# Patient Record
Sex: Male | Born: 1980 | Race: Asian | Hispanic: No | Marital: Single | Smoking: Never smoker
Health system: Southern US, Community
[De-identification: ages and names within clinical notes are randomized; demographics above are authoritative.]

## PROBLEM LIST (undated history)

## (undated) DIAGNOSIS — N200 Calculus of kidney: Secondary | ICD-10-CM

## (undated) DIAGNOSIS — F329 Major depressive disorder, single episode, unspecified: Secondary | ICD-10-CM

## (undated) DIAGNOSIS — E559 Vitamin D deficiency, unspecified: Secondary | ICD-10-CM

## (undated) DIAGNOSIS — S92919A Unspecified fracture of unspecified toe(s), initial encounter for closed fracture: Secondary | ICD-10-CM

## (undated) DIAGNOSIS — J45909 Unspecified asthma, uncomplicated: Secondary | ICD-10-CM

## (undated) HISTORY — DX: Vitamin D deficiency, unspecified: E55.9

## (undated) HISTORY — DX: Major depressive disorder, single episode, unspecified: F32.9

## (undated) HISTORY — DX: Unspecified fracture of unspecified toe(s), initial encounter for closed fracture: S92.919A

## (undated) HISTORY — PX: LITHOTRIPSY: SUR834

## (undated) HISTORY — DX: Unspecified asthma, uncomplicated: J45.909

---

## 2006-08-24 ENCOUNTER — Ambulatory Visit: Payer: Self-pay | Admitting: Internal Medicine

## 2006-08-24 DIAGNOSIS — B009 Herpesviral infection, unspecified: Secondary | ICD-10-CM | POA: Insufficient documentation

## 2006-08-24 DIAGNOSIS — K648 Other hemorrhoids: Secondary | ICD-10-CM | POA: Insufficient documentation

## 2006-08-28 ENCOUNTER — Encounter (INDEPENDENT_AMBULATORY_CARE_PROVIDER_SITE_OTHER): Payer: Self-pay | Admitting: *Deleted

## 2006-08-28 LAB — CONVERTED CEMR LAB
BUN: 15 mg/dL (ref 6–23)
Basophils Relative: 0.1 % (ref 0.0–1.0)
CO2: 30 meq/L (ref 19–32)
Chloride: 104 meq/L (ref 96–112)
Creatinine, Ser: 1.2 mg/dL (ref 0.4–1.5)
Direct LDL: 177.1 mg/dL
HCT: 45.3 % (ref 39.0–52.0)
Hemoglobin: 15.3 g/dL (ref 13.0–17.0)
Monocytes Absolute: 0.4 10*3/uL (ref 0.2–0.7)
Neutrophils Relative %: 55 % (ref 43.0–77.0)
Potassium: 3.7 meq/L (ref 3.5–5.1)
RBC: 5.06 M/uL (ref 4.22–5.81)
RDW: 11.6 % (ref 11.5–14.6)
Sodium: 142 meq/L (ref 135–145)
TSH: 1.92 microintl units/mL (ref 0.35–5.50)
Total CHOL/HDL Ratio: 5.7
Triglycerides: 94 mg/dL (ref 0–149)
VLDL: 19 mg/dL (ref 0–40)

## 2007-02-16 ENCOUNTER — Emergency Department (HOSPITAL_COMMUNITY): Admission: EM | Admit: 2007-02-16 | Discharge: 2007-02-16 | Payer: Self-pay | Admitting: Family Medicine

## 2007-12-05 ENCOUNTER — Emergency Department (HOSPITAL_COMMUNITY): Admission: EM | Admit: 2007-12-05 | Discharge: 2007-12-05 | Payer: Self-pay | Admitting: Family Medicine

## 2008-05-25 ENCOUNTER — Emergency Department (HOSPITAL_COMMUNITY): Admission: EM | Admit: 2008-05-25 | Discharge: 2008-05-25 | Payer: Self-pay | Admitting: Emergency Medicine

## 2008-05-28 ENCOUNTER — Ambulatory Visit (HOSPITAL_COMMUNITY): Admission: RE | Admit: 2008-05-28 | Discharge: 2008-05-28 | Payer: Self-pay | Admitting: Urology

## 2008-06-18 ENCOUNTER — Ambulatory Visit: Payer: Self-pay | Admitting: Internal Medicine

## 2008-06-22 LAB — CONVERTED CEMR LAB
ALT: 48 units/L (ref 0–53)
Basophils Relative: 0 % (ref 0.0–3.0)
CO2: 32 meq/L (ref 19–32)
Chloride: 104 meq/L (ref 96–112)
Creatinine, Ser: 1.1 mg/dL (ref 0.4–1.5)
Direct LDL: 142.2 mg/dL
Eosinophils Absolute: 0.1 10*3/uL (ref 0.0–0.7)
Eosinophils Relative: 1.9 % (ref 0.0–5.0)
HCT: 43.7 % (ref 39.0–52.0)
HDL: 42.7 mg/dL (ref >39.00)
Hemoglobin: 15.3 g/dL (ref 13.0–17.0)
Lymphs Abs: 1.3 10*3/uL (ref 0.7–4.0)
MCHC: 35 g/dL (ref 30.0–36.0)
MCV: 88.1 fL (ref 78.0–100.0)
Monocytes Absolute: 0.1 10*3/uL (ref 0.1–1.0)
Neutro Abs: 3.9 10*3/uL (ref 1.4–7.7)
Neutrophils Relative %: 70.5 % (ref 43.0–77.0)
Potassium: 4.2 meq/L (ref 3.5–5.1)
RBC: 4.96 M/uL (ref 4.22–5.81)
Total CHOL/HDL Ratio: 5
Triglycerides: 114 mg/dL (ref 0.0–149.0)
VLDL: 22.8 mg/dL (ref 0.0–40.0)
WBC: 5.4 10*3/uL (ref 4.5–10.5)

## 2009-01-24 ENCOUNTER — Emergency Department (HOSPITAL_COMMUNITY): Admission: EM | Admit: 2009-01-24 | Discharge: 2009-01-24 | Payer: Self-pay | Admitting: Emergency Medicine

## 2009-11-15 ENCOUNTER — Emergency Department (HOSPITAL_COMMUNITY): Admission: EM | Admit: 2009-11-15 | Discharge: 2009-11-15 | Payer: Self-pay | Admitting: Family Medicine

## 2010-05-12 LAB — URINALYSIS, ROUTINE W REFLEX MICROSCOPIC
Bilirubin Urine: NEGATIVE
Ketones, ur: NEGATIVE mg/dL
Nitrite: NEGATIVE
Protein, ur: NEGATIVE mg/dL
pH: 6 (ref 5.0–8.0)

## 2010-05-21 LAB — URINALYSIS, ROUTINE W REFLEX MICROSCOPIC
Bilirubin Urine: NEGATIVE
Ketones, ur: NEGATIVE mg/dL
Specific Gravity, Urine: 1.031 — ABNORMAL HIGH (ref 1.005–1.030)
Urobilinogen, UA: 0.2 mg/dL (ref 0.0–1.0)

## 2010-05-21 LAB — URINE MICROSCOPIC-ADD ON

## 2011-05-30 ENCOUNTER — Emergency Department (INDEPENDENT_AMBULATORY_CARE_PROVIDER_SITE_OTHER): Payer: 59

## 2011-05-30 ENCOUNTER — Encounter (HOSPITAL_BASED_OUTPATIENT_CLINIC_OR_DEPARTMENT_OTHER): Payer: Self-pay | Admitting: *Deleted

## 2011-05-30 ENCOUNTER — Emergency Department (HOSPITAL_BASED_OUTPATIENT_CLINIC_OR_DEPARTMENT_OTHER)
Admission: EM | Admit: 2011-05-30 | Discharge: 2011-05-31 | Disposition: A | Discharge status: Home / Self Care | Payer: 59 | Attending: Emergency Medicine | Admitting: Emergency Medicine

## 2011-05-30 DIAGNOSIS — Z09 Encounter for follow-up examination after completed treatment for conditions other than malignant neoplasm: Secondary | ICD-10-CM

## 2011-05-30 DIAGNOSIS — S93336A Other dislocation of unspecified foot, initial encounter: Secondary | ICD-10-CM

## 2011-05-30 DIAGNOSIS — S92919B Unspecified fracture of unspecified toe(s), initial encounter for open fracture: Secondary | ICD-10-CM | POA: Insufficient documentation

## 2011-05-30 DIAGNOSIS — S91309A Unspecified open wound, unspecified foot, initial encounter: Secondary | ICD-10-CM | POA: Insufficient documentation

## 2011-05-30 DIAGNOSIS — W108XXA Fall (on) (from) other stairs and steps, initial encounter: Secondary | ICD-10-CM | POA: Insufficient documentation

## 2011-05-30 DIAGNOSIS — S91109A Unspecified open wound of unspecified toe(s) without damage to nail, initial encounter: Secondary | ICD-10-CM | POA: Insufficient documentation

## 2011-05-30 DIAGNOSIS — W010XXA Fall on same level from slipping, tripping and stumbling without subsequent striking against object, initial encounter: Secondary | ICD-10-CM

## 2011-05-30 DIAGNOSIS — S91105A Unspecified open wound of left lesser toe(s) without damage to nail, initial encounter: Secondary | ICD-10-CM

## 2011-05-30 HISTORY — DX: Calculus of kidney: N20.0

## 2011-05-30 MED ORDER — BUPIVACAINE HCL 0.25 % IJ SOLN
INTRAMUSCULAR | Status: AC
  Administered 2011-05-30: 10 mL
  Filled 2011-05-30: qty 1

## 2011-05-30 MED ORDER — CEPHALEXIN 500 MG PO CAPS: 500.0000 mg | ORAL_CAPSULE | Freq: Four times a day (QID) | ORAL | Status: AC

## 2011-05-30 MED ORDER — LIDOCAINE HCL (PF) 1 % IJ SOLN
5.0000 mL | Freq: Once | INTRAMUSCULAR | Status: AC
  Administered 2011-05-30: 5 mL

## 2011-05-30 MED ORDER — OXYCODONE-ACETAMINOPHEN 5-325 MG PO TABS: 2.0000 | ORAL_TABLET | ORAL | Status: AC | PRN

## 2011-05-30 MED ORDER — CEFAZOLIN SODIUM 1-5 GM-% IV SOLN
1.0000 g | Freq: Once | INTRAVENOUS | Status: AC
  Administered 2011-05-30: 1 g via INTRAVENOUS
  Filled 2011-05-30: qty 50

## 2011-05-30 MED ORDER — BUPIVACAINE HCL 0.25 % IJ SOLN
10.0000 mL | Freq: Once | INTRAMUSCULAR | Status: AC
  Administered 2011-05-30: 10 mL

## 2011-05-30 MED ORDER — LIDOCAINE HCL (PF) 1 % IJ SOLN
INTRAMUSCULAR | Status: AC
  Administered 2011-05-30: 5 mL
  Filled 2011-05-30: qty 5

## 2011-05-30 MED ORDER — TETANUS-DIPHTH-ACELL PERTUSSIS 5-2.5-18.5 LF-MCG/0.5 IM SUSP
0.5000 mL | Freq: Once | INTRAMUSCULAR | Status: AC
  Administered 2011-05-30: 0.5 mL via INTRAMUSCULAR
  Filled 2011-05-30: qty 0.5

## 2011-05-30 NOTE — ED Provider Notes (Signed)
History   This chart was scribed for Rolan Bucco, MD by Charolett Bumpers . The patient was seen in room MH08/MH08 and the patient's care was started at 10:14pm.   CSN: [id number]  Arrival date & time 05/30/11  2118   First MD Initiated Contact with Patient 05/30/11 2142      Chief Complaint  Patient presents with  . Toe Injury    (Consider location/radiation/quality/duration/timing/severity/associated sxs/prior treatment) HPI Jeff Massey is a 31 y.o. male who presents to the Emergency Department complaining of constant, moderate left toe injury that occurred PTA. Patient states that he was walking up stairs when he tripped and injured toe. Patient states that the injury is to his left big toe. Patient states that the bone came through the skin. Patient reports minimal pain associated with the injury. Patient states that his sensation is still intact in left toes. Bleeding is currently controlled here in ED. Patient denies any other injuries. Patient states that his other toes feel normal. Patient states that his tetanus needs updating. No other symptoms reported. No pertinent medical hx reported.     Past Medical History  Diagnosis Date  . Kidney stones     History reviewed. No pertinent past surgical history.  History reviewed. No pertinent family history.  History  Substance Use Topics  . Smoking status: Never Smoker   . Smokeless tobacco: Not on file  . Alcohol Use:       Review of Systems A complete 10 system review of systems was obtained and all systems are negative except as noted in the HPI and PMH.   Allergies  Review of patient's allergies indicates no known allergies.  Home Medications   Current Outpatient Rx  Name Route Sig Dispense Refill  . CRANBERRY 1000 MG PO CAPS Oral Take 3 capsules by mouth 2 (two) times daily.    Marland Kitchen GARLIC 100 MG PO TABS Oral Take 2 tablets by mouth.    . MULTIVITAMINS PO TABS Oral Take 1 tablet by mouth daily.    .  CEPHALEXIN 500 MG PO CAPS Oral Take 1 capsule (500 mg total) by mouth 4 (four) times daily. 28 capsule 0  . OXYCODONE-ACETAMINOPHEN 5-325 MG PO TABS Oral Take 2 tablets by mouth every 4 (four) hours as needed for pain. 20 tablet 0    BP 149/90  Pulse 68  Temp(Src) 97.9 F (36.6 C) (Oral)  Resp 20  SpO2 100%  Physical Exam  Nursing note and vitals reviewed. Constitutional: He is oriented to person, place, and time. He appears well-developed and well-nourished. No distress.  HENT:  Head: Normocephalic and atraumatic.  Right Ear: External ear normal.  Left Ear: External ear normal.  Nose: Nose normal.  Eyes: Conjunctivae and EOM are normal. Pupils are equal, round, and reactive to light.  Neck: Normal range of motion. Neck supple. No tracheal deviation present.  Cardiovascular: Normal rate.   Pulmonary/Chest: Effort normal. No respiratory distress.  Abdominal: Soft. He exhibits no distension.  Musculoskeletal: Normal range of motion. He exhibits no edema.       Left big toe deformity with inoculation. Visible bone noted. 2 cm laceration over IP joint. Sensation intact to touch over distal toes. Capillary refill less than 2 seconds. No other body tenderness to left foot.    Neurological: He is alert and oriented to person, place, and time. No sensory deficit.  Skin: Skin is warm and dry.  Psychiatric: He has a normal mood and affect. His behavior is normal.  ED Course  Reduction of dislocation Date/Time: 05/30/2011 11:00 PM Performed by: Ashelynn Marks Authorized by: Rolan Bucco Consent: Verbal consent obtained. Risks and benefits: risks, benefits and alternatives were discussed Consent given by: patient Patient identity confirmed: verbally with patient Preparation: Patient was prepped and draped in the usual sterile fashion. Local anesthesia used: yes Anesthesia: digital block Local anesthetic: lidocaine 1% without epinephrine and bupivacaine 0.25% without  epinephrine Anesthetic total: 6 ml Patient sedated: no Patient tolerance: Patient tolerated the procedure well with no immediate complications. Comments: Toe reduced with traction/countertraction.  LACERATION REPAIR Date/Time: 05/30/2011 11:01 PM Performed by: Doyne Micke Authorized by: Rolan Bucco Consent: Verbal consent obtained. Risks and benefits: risks, benefits and alternatives were discussed Consent given by: patient Patient identity confirmed: verbally with patient Body area: lower extremity Location details: left foot Laceration length: 2 cm Foreign bodies: no foreign bodies Tendon involvement: none Nerve involvement: none Vascular damage: no Anesthesia: digital block Local anesthetic: lidocaine 1% without epinephrine and bupivacaine 0.25% without epinephrine Anesthetic total: 6 ml Preparation: Patient was prepped and draped in the usual sterile fashion. Irrigation solution: saline Irrigation method: syringe Amount of cleaning: extensive Skin closure: 4-0 Prolene Number of sutures: 4 Technique: simple Approximation: loose Approximation difficulty: simple Dressing: 4x4 sterile gauze Patient tolerance: Patient tolerated the procedure well with no immediate complications.   (including critical care time)  DIAGNOSTIC STUDIES: Oxygen Saturation is 100% on room air, normal by my interpretation.    COORDINATION OF CARE:  2214: Discussed planned course of treatment with the patient, who is agreeable at this time.  2215: Medication OrdersLidocaine (XYLOCAINE) 1 % injection 5 mL-Once;  Bupivacaine (MARCAINE) 0.25 % (with pres) injection 10 mL-Once  2220: Preformed a relocation of the left big toe and laceration repair 2230: Discussed f/u with an orthopedic specialist.  2245: Medication Orders: TDaP (BOOSTRIX) injection 0.5 mL Once; Cefazolin (ANCEF) IVPB 1 g/50 mL premix Once   Labs Reviewed - No data to display Dg Foot 2 Views Left  05/31/2011  *RADIOLOGY  REPORT*  Clinical Data: Post reduction of left first toe dislocation  LEFT FOOT - 2 VIEW  Comparison: 05/30/2011 at 2203 hours  Findings: Interval relocation of the first distal phalanx.  No fracture is seen.  The joint spaces are preserved.  Mild soft tissue irregularity along the lateral base of the first digit.  Possible mild soft tissue irregularity along the medial aspect of the third distal phalanx.  IMPRESSION: Interval relocation of the first distal phalanx.  Original Report Authenticated By: Charline Bills, M.D.   Dg Toe Great Left  05/30/2011  *RADIOLOGY REPORT*  Clinical Data: Injury to the left great toe after tripping on stairs.  LEFT GREAT TOE  Comparison: None.  Findings: There is complete medial dislocation of the distal phalanx of the left first toe with respect to the proximal phalanx. No associated fractures are appreciated.  IMPRESSION: Dislocation of the left first toe interphalangeal joint.  Original Report Authenticated By: Marlon Pel, M.D.     1. Dislocation of toe, left, open       MDM  PT with open toe dislocation.  Pt given IV abx.  TDAP updated. Reduced, wound repaired.  Spoke with Dr. Magnus Ivan who advises post op shoe, non weight bearing for now.  Will f/u in their office   I personally performed the services described in this documentation, which was scribed in my presence.  The recorded information has been reviewed and considered.       Rolan Bucco,  MD 05/31/11 0006

## 2011-05-30 NOTE — Discharge Instructions (Signed)
Toe Dislocation  Toe dislocation is the displacement of the large bone of your toe (metatarsal) from the socket that connects it to your foot. Very strong, fibrous tissues (ligaments) connect your toe to a bone in your foot and stabilize the joint where these 2 bones meet. Dislocation is caused by a forceful impact to your toe. This impact moves your toe off the joint and often injures the ligaments that support it.  SYMPTOMS  Symptoms of toe dislocation include:  · Noticeable deformity of your toe.  · Pain, with loss of movement.  · Looseness in your joint, indicating a tear of the ligaments (severe dislocations).  DIAGNOSIS  Toe dislocation is diagnosed through a physical exam. Often, X-ray exams are done to see if you have associated injuries, such as bone fractures.  TREATMENT  Toe dislocations are treated by putting your bones back into position (reduction) either by manipulation or through surgery. The toe is then kept in a fixed position (immobilized) in a cast, splint, or rigid postoperative shoe. In rare cases, surgical repair of a ligament is required, followed by immobilization of the toe.   HOME CARE INSTRUCTIONS  The following measures can help to reduce pain and speed up the healing process:  · Rest your injured joint. Do not move it. Avoid activities similar to the one that caused your injury.  · Apply ice to your injured joint for 1 to 2 days after your reduction or as directed by your caregiver. Applying ice helps to reduce inflammation and pain.  · Put ice in a plastic bag.  · Place a towel between your skin and the bag.  · Leave the ice on for 15 to 20 minutes at a time, every 2 hours while you are awake.  · Elevate your foot above your heart as directed by your caregiver.  · Take over-the-counter or perscription medicine for pain as directed by your caregiver.  · If your caregiver has taped your toe to an adjacent toe, change the tape as directed by your caregiver.  SEEK IMMEDIATE MEDICAL CARE  IF:  · Your cast or splint becomes loose or damaged.  · Your pain becomes worse, not better.  · You lose feeling in your toe, or you cannot bend the tip of your toe.  MAKE SURE YOU:  · Understand these instructions.  · Will watch your condition.  · Will get help right away if you are not doing well or get worse.  Document Released: 10/21/2000 Document Revised: 01/15/2011 Document Reviewed: 06/26/2010  ExitCare® Patient Information ©2012 [employer].

## 2011-05-30 NOTE — ED Notes (Signed)
Pt presents to ED today with Open great toe fx.  Pt reports "tripping up stairs".

## 2012-02-23 ENCOUNTER — Encounter: Payer: 59 | Admitting: Internal Medicine

## 2012-06-01 ENCOUNTER — Ambulatory Visit: Payer: 59

## 2012-06-01 ENCOUNTER — Ambulatory Visit (INDEPENDENT_AMBULATORY_CARE_PROVIDER_SITE_OTHER): Payer: 59 | Admitting: Family Medicine

## 2012-06-01 VITALS — BP 122/78 | HR 99 | Temp 99.0°F | Resp 18 | Ht 68.0 in | Wt 174.0 lb

## 2012-06-01 DIAGNOSIS — J45901 Unspecified asthma with (acute) exacerbation: Secondary | ICD-10-CM

## 2012-06-01 DIAGNOSIS — J309 Allergic rhinitis, unspecified: Secondary | ICD-10-CM

## 2012-06-01 DIAGNOSIS — R0602 Shortness of breath: Secondary | ICD-10-CM

## 2012-06-01 LAB — POCT CBC
Hemoglobin: 16.2 g/dL (ref 14.1–18.1)
Lymph, poc: 0.8 (ref 0.6–3.4)
MCH, POC: 29.5 pg (ref 27–31.2)
MCHC: 32.9 g/dL (ref 31.8–35.4)
MCV: 89.7 fL (ref 80–97)
MID (cbc): 0.4 (ref 0–0.9)
RBC: 5.5 M/uL (ref 4.69–6.13)
WBC: 13.8 10*3/uL — AB (ref 4.6–10.2)

## 2012-06-01 LAB — COMPREHENSIVE METABOLIC PANEL
Albumin: 4.7 g/dL (ref 3.5–5.2)
Alkaline Phosphatase: 59 U/L (ref 39–117)
CO2: 23 mEq/L (ref 19–32)
Calcium: 9.7 mg/dL (ref 8.4–10.5)
Chloride: 100 mEq/L (ref 96–112)
Glucose, Bld: 116 mg/dL — ABNORMAL HIGH (ref 70–99)
Potassium: 4.1 mEq/L (ref 3.5–5.3)
Sodium: 135 mEq/L (ref 135–145)
Total Protein: 7.5 g/dL (ref 6.0–8.3)

## 2012-06-01 LAB — D-DIMER, QUANTITATIVE: D-Dimer, Quant: 0.27 ug/mL-FEU (ref 0.00–0.48)

## 2012-06-01 LAB — POCT URINALYSIS DIPSTICK
Glucose, UA: NEGATIVE
Ketones, UA: NEGATIVE
Spec Grav, UA: 1.015

## 2012-06-01 LAB — TSH: TSH: 0.564 u[IU]/mL (ref 0.350–4.500)

## 2012-06-01 MED ORDER — LEVALBUTEROL HCL 0.63 MG/3ML IN NEBU
0.6300 mg | INHALATION_SOLUTION | Freq: Once | RESPIRATORY_TRACT | Status: AC
  Administered 2012-06-01: 0.63 mg via RESPIRATORY_TRACT

## 2012-06-01 MED ORDER — MOMETASONE FUROATE 50 MCG/ACT NA SUSP: 2.0000 | Freq: Every day | NASAL | Status: DC

## 2012-06-01 MED ORDER — ALBUTEROL SULFATE HFA 108 (90 BASE) MCG/ACT IN AERS: 2.0000 | INHALATION_SPRAY | Freq: Four times a day (QID) | RESPIRATORY_TRACT | Status: AC | PRN

## 2012-06-01 NOTE — Patient Instructions (Addendum)
1.  PRESENT TO EMERGENCY DEPARTMENT IMMEDIATELY FOR RECURRENT SHORTNESS OF BREATH.

## 2012-06-01 NOTE — Progress Notes (Signed)
[address]   [place], [place]  [address]   [phone]  Subjective:    Patient ID: Jeff Massey, male    DOB: 07/31/1980, 32 y.o.   MRN: [id number]  HPI This 32 y.o. male presents for evaluation of headache, tachycardia, muscle aches, dizziness, labored breathing/SOB.  No fever but +chills at work; no sweats.  +HA and dizziness; headache all over.  No sore throat, +rhinorrhea due to allergies.  No cough. No n/v/d.  Dizziness is not severe.  No palpitations currently.  Works at cath lab; cardiologist listened to patient; pulse 90, normal pulse oximetry. Ran yesterday.  Felt well yesterday.  No tick bites in past month.  No similar symptoms.  No travel; no recent surgeries.  No change in medications.  Taking OTC medication.  No abdominal pain.  No urinary symptoms.  No blurred vision or diplopia.  Yesterday, ran 3 miles on treadmill yesterday; did leg work out with squats, curls, calves; normal leg work out.  No bloody stools.  Has appointment early May for PCP for allergic rhinitis and new onset depression; no anxiety with depression symptoms; mostly down and out and angry.  History of asthma as child; last treatment for asthma age 20-6; does not remember asthma symptoms. Feels like unable to take a deep breath; no wheezing.  No chest pain but chest tightness.  No family history of clotting disorder.  Does not know father well but strong family history of heart disease.   No leg swelling or calf pain.   Review of Systems  Constitutional: Positive for chills and diaphoresis. Negative for fever and fatigue.  HENT: Positive for congestion, rhinorrhea and postnasal drip. Negative for ear pain, sore throat, drooling, trouble swallowing and voice change.   Respiratory: Positive for shortness of breath. Negative for cough, wheezing and stridor.   Cardiovascular: Positive for palpitations. Negative for chest pain and leg swelling.  Gastrointestinal: Negative for nausea, vomiting, abdominal pain, diarrhea,  blood in stool and abdominal distention.  Genitourinary: Negative for dysuria, urgency, frequency and hematuria.  Allergic/Immunologic: Positive for environmental allergies.  Neurological: Positive for dizziness and headaches. Negative for weakness, light-headedness and numbness.  Psychiatric/Behavioral: Positive for dysphoric mood. The patient is not nervous/anxious.         Past Medical History  Diagnosis Date  . Kidney stones     Past Surgical History  Procedure Laterality Date  . Lithotripsy      Prior to Admission medications   Medication Sig Start Date End Date Taking? Authorizing Provider  Cranberry 1000 MG CAPS Take 3 capsules by mouth 2 (two) times daily.   Yes Historical Provider, MD  Garlic 100 MG TABS Take 2 tablets by mouth.   Yes Historical Provider, MD  multivitamin [facility]) per tablet Take 1 tablet by mouth daily.   Yes Historical Provider, MD    No Known Allergies  History   Social History  . Marital Status: Single    Spouse Name: N/A    Number of Children: N/A  . Years of Education: N/A   Occupational History  .  [employer]    Cath lab   Social History Main Topics  . Smoking status: Never Smoker   . Smokeless tobacco: Not on file  . Alcohol Use: 0.6 oz/week    1 Glasses of wine per week     Comment: social  . Drug Use: No  . Sexually Active: Yes   Other Topics Concern  . Not on file   Social  History Narrative  . No narrative on file    Family History  Problem Relation Age of Onset  . Cancer Maternal Grandmother   . Cancer Maternal Grandfather     Objective:   Physical Exam  Nursing note and vitals reviewed. Constitutional: He is oriented to person, place, and time. He appears well-developed and well-nourished. He appears distressed.  HENT:  Head: Normocephalic and atraumatic.  Right Ear: External ear normal.  Left Ear: External ear normal.  Nose: Nose normal.  Mouth/Throat: Oropharynx is clear and moist.  Eyes: Conjunctivae  and EOM are normal. Pupils are equal, round, and reactive to light.  Neck: Normal range of motion. Neck supple. No JVD present. No tracheal deviation present. No thyromegaly present.  Cardiovascular: Normal rate, regular rhythm, normal heart sounds and intact distal pulses.  Exam reveals no gallop and no friction rub.   No murmur heard. Pulmonary/Chest: Breath sounds normal. No accessory muscle usage or stridor. Tachypnea noted. He is in respiratory distress. He has no wheezes. He has no rhonchi. He has no rales.  Mild respiratory distress; RR 24.  Abdominal: Soft. Bowel sounds are normal. He exhibits no distension and no mass. There is no tenderness. There is no rebound and no guarding.  Lymphadenopathy:    He has no cervical adenopathy.  Neurological: He is alert and oriented to person, place, and time. No cranial nerve deficit. He exhibits normal muscle tone. Coordination normal.  Skin: Skin is warm and dry. No rash noted. He is not diaphoretic.  Psychiatric: He has a normal mood and affect. His behavior is normal. Judgment and thought content normal.  Slight anxiety due to breathing issues/respiratory distress mild.    EKG: NSR; no acute changes.  [facility] reading (PRIMARY) by  Dr. Katrinka Blazing. CXR: NAD.  Results for orders placed in visit on 06/01/12  POCT CBC      Result Value Range   WBC 13.8 (*) 4.6 - 10.2 K/uL   Lymph, poc 0.8  0.6 - 3.4   POC LYMPH PERCENT 5.7 (*) 10 - 50 %L   MID (cbc) 0.4  0 - 0.9   POC MID % 3.0  0 - 12 %M   POC Granulocyte 12.6 (*) 2 - 6.9   Granulocyte percent 91.3 (*) 37 - 80 %G   RBC 5.50  4.69 - 6.13 M/uL   Hemoglobin 16.2  14.1 - 18.1 g/dL   HCT, POC 16.1  09.6 - 53.7 %   MCV 89.7  80 - 97 fL   MCH, POC 29.5  27 - 31.2 pg   MCHC 32.9  31.8 - 35.4 g/dL   RDW, POC 04.5     Platelet Count, POC 337  142 - 424 K/uL   MPV 9.6  0 - 99.8 fL  GLUCOSE, POCT (MANUAL RESULT ENTRY)      Result Value Range   POC Glucose 119 (*) 70 - 99 mg/dl  POCT URINALYSIS  DIPSTICK      Result Value Range   Color, UA yellow     Clarity, UA clear     Glucose, UA neg     Bilirubin, UA neg     Ketones, UA neg     Spec Grav, UA 1.015     Blood, UA trace     pH, UA 7.0     Protein, UA neg     Urobilinogen, UA 1.0     Nitrite, UA neg     Leukocytes, UA Negative  XOPENEX NEBULIZER ADMINISTERED IN OFFICE.    Assessment & Plan:  SOB (shortness of breath) - Plan: POCT CBC, POCT glucose (manual entry), POCT urinalysis dipstick, Comprehensive metabolic panel, TSH, D-dimer, quantitative, EKG 12-Lead, levalbuterol (XOPENEX) nebulizer solution 0.63 mg, DG Chest 2 View, 2D Echocardiogram without contrast  Allergic rhinitis - Plan: mometasone (NASONEX) 50 MCG/ACT nasal spray  Asthma with acute exacerbation - Plan: albuterol (PROVENTIL HFA;VENTOLIN HFA) 108 (90 BASE) MCG/ACT inhaler   1.  SOB:  New.  Acute onset.  Mild improvement with Xopenex nebulizer.  CXR negative.  Obtain labs including D-dimer; pt low risk for DVT/PE yet if D-dimer positive, will obtain CT angio.  Obtain 2D-echo to evaluate pericardium.  Feel SOB due to mild asthma exacerbation due to allergic rhinitis; with mild respiratory distress, patient developed anxiety.  Improvement during visit; pt comfortable to be discharged; agreeable to ED evaluation if recurrent symptoms. 2.  Allergic Rhinitis: worsening; rx for Nasonex provided.   3. Asthma with acute exacerbation:  New.  SOB improved with Xopenex nebulizer; recommend Albuterol HFA scheduled every six hours for next five days and then PRN.

## 2012-06-02 ENCOUNTER — Encounter: Payer: Self-pay | Admitting: Family Medicine

## 2012-06-06 ENCOUNTER — Telehealth: Payer: Self-pay

## 2012-06-06 NOTE — Telephone Encounter (Signed)
Noted  

## 2012-06-06 NOTE — Telephone Encounter (Signed)
Pt is calling back about some lab results Call back number is [phone]

## 2012-06-06 NOTE — Telephone Encounter (Signed)
Notes Recorded by Ethelda Chick, MD on 06/03/2012 at 12:22 PM Call --- 1. Blood sugar 116. 2. Liver and kidney functions normal. 3. Thyroid function normal. 4. D-dimer normal/negative. 5. How is he feeling? How is breathing? Called him, he states he is much better.

## 2012-06-16 ENCOUNTER — Encounter: Payer: Self-pay | Admitting: Internal Medicine

## 2012-06-16 ENCOUNTER — Ambulatory Visit (INDEPENDENT_AMBULATORY_CARE_PROVIDER_SITE_OTHER): Payer: 59 | Admitting: Internal Medicine

## 2012-06-16 VITALS — BP 112/72 | HR 59 | Temp 98.1°F | Wt 174.0 lb

## 2012-06-16 DIAGNOSIS — F329 Major depressive disorder, single episode, unspecified: Secondary | ICD-10-CM

## 2012-06-16 MED ORDER — ESCITALOPRAM OXALATE 10 MG PO TABS: 10.0000 mg | ORAL_TABLET | Freq: Every day | ORAL | Status: DC

## 2012-06-16 NOTE — Patient Instructions (Addendum)
Next visit 4 to 5 weeks

## 2012-06-16 NOTE — Progress Notes (Signed)
  Subjective:    Patient ID: Jeff Massey, male    DOB: 09/18/1980, 32 y.o.   MRN: [id number]  HPI New patient, last OV [id number]. 32 year old gentleman who reports a history of depression on and off for years, symptoms are usually during the winter time and last few months. This year however the symptoms are lasting much longer than usual: Depression is described as a feeling of  blue, sad, irritable, withdrawal from family and friends. He has a girlfriend and he can get "very mean" with her sometimes. Depression is episodic, he may get that way suddenly for several hours.  Past Medical History  Diagnosis Date  . Kidney stones   . Asthma   . Toe fracture    Past Surgical History  Procedure Laterality Date  . Lithotripsy       Family History:    Father: unknown    Mother: living    DM - M. 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 Denies any suicidal or homicidal ideas. No recent weight loss. He has been taking a number of herbal supplements for years. Occasional difficulty concentrating and some procrastination, those features have been more noticeable lately. He is usually easy going/happy   but he does not describe clear-cut manic episodes.     Objective:   Physical Exam General -- alert, well-developed, No apparent distress Neck --no thyromegaly Lungs -- normal respiratory effort, no intercostal retractions, no accessory muscle use, and normal breath sounds.   Heart-- normal rate, regular rhythm, no murmur, and no gallop.   Extremities-- no pretibial edema bilaterally  Neurologic-- alert & oriented X3 and strength normal in all extremities. Psych-- Cognition and judgment appear intact. Alert and  cooperative with normal attention span and concentration.  not anxious appearing and not depressed appearing.       Assessment & Plan:

## 2012-06-17 ENCOUNTER — Encounter: Payer: Self-pay | Admitting: Internal Medicine

## 2012-06-17 DIAGNOSIS — F32A Depression, unspecified: Secondary | ICD-10-CM

## 2012-06-17 DIAGNOSIS — F329 Major depressive disorder, single episode, unspecified: Secondary | ICD-10-CM | POA: Insufficient documentation

## 2012-06-17 HISTORY — DX: Depression, unspecified: F32.A

## 2012-06-17 NOTE — Assessment & Plan Note (Signed)
Depression, Episodic depression, Usually associated with winter, this year lasting longer than usual,no suicidal ideas. PHQ 9: #17. Given cyclic presentation he could be bipolar however he doesn't seem to have mania. Some lack of concentration, needs to be reassessed once depression is treated. We talk about possible referral to psychiatry versus SSRIs, we eventually agreed to SSRIs, will start Lexapro, he will watch for side effects, hyperactivity, etc. Encourage counseling. Followup in June.

## 2012-07-12 ENCOUNTER — Other Ambulatory Visit (HOSPITAL_COMMUNITY): Payer: 59

## 2012-07-28 ENCOUNTER — Encounter: Payer: 59 | Admitting: Internal Medicine

## 2012-07-29 ENCOUNTER — Encounter: Payer: 59 | Admitting: Internal Medicine

## 2012-08-08 ENCOUNTER — Other Ambulatory Visit: Payer: Self-pay | Admitting: Internal Medicine

## 2012-08-08 NOTE — Telephone Encounter (Signed)
Refill done. Scheduled future appointment 08/25/12.

## 2012-08-25 ENCOUNTER — Encounter: Payer: Self-pay | Admitting: Internal Medicine

## 2012-08-25 ENCOUNTER — Ambulatory Visit (INDEPENDENT_AMBULATORY_CARE_PROVIDER_SITE_OTHER): Payer: 59 | Admitting: Internal Medicine

## 2012-08-25 VITALS — BP 104/75 | HR 65 | Temp 98.3°F | Ht 67.0 in | Wt 173.8 lb

## 2012-08-25 DIAGNOSIS — F329 Major depressive disorder, single episode, unspecified: Secondary | ICD-10-CM

## 2012-08-25 DIAGNOSIS — Z0000 Encounter for general adult medical examination without abnormal findings: Secondary | ICD-10-CM | POA: Insufficient documentation

## 2012-08-25 LAB — HEMOGLOBIN A1C: Hgb A1c MFr Bld: 5.6 % (ref 4.6–6.5)

## 2012-08-25 LAB — CBC WITH DIFFERENTIAL/PLATELET
Basophils Absolute: 0 10*3/uL (ref 0.0–0.1)
Basophils Relative: 0.4 % (ref 0.0–3.0)
Eosinophils Relative: 1.9 % (ref 0.0–5.0)
HCT: 44.1 % (ref 39.0–52.0)
Hemoglobin: 15.3 g/dL (ref 13.0–17.0)
Lymphocytes Relative: 31.1 % (ref 12.0–46.0)
Lymphs Abs: 1.3 10*3/uL (ref 0.7–4.0)
Monocytes Relative: 6.7 % (ref 3.0–12.0)
Neutro Abs: 2.6 10*3/uL (ref 1.4–7.7)
RBC: 5.08 Mil/uL (ref 4.22–5.81)
WBC: 4.3 10*3/uL — ABNORMAL LOW (ref 4.5–10.5)

## 2012-08-25 LAB — LIPID PANEL
HDL: 39.1 mg/dL (ref >39.00)
Total CHOL/HDL Ratio: 6
VLDL: 25.8 mg/dL (ref 0.0–40.0)

## 2012-08-25 LAB — LDL CHOLESTEROL, DIRECT: Direct LDL: 146.1 mg/dL

## 2012-08-25 MED ORDER — ESCITALOPRAM OXALATE 10 MG PO TABS: 10.0000 mg | ORAL_TABLET | Freq: Every day | ORAL | Status: DC

## 2012-08-25 NOTE — Patient Instructions (Signed)
Testicular Problems and Self-Exam   Men can examine themselves easily and effectively with positive results. Monthly exams detect problems early and save lives. There are numerous causes of swelling in the testicle. Testicular cancer usually appears as a firm painless lump in the front part of the testicle. This may feel like a dull ache or heavy feeling located in the lower abdomen (belly), groin, or scrotum.   The risk is greater in men with undescended testicles and it is more common in young men. It is responsible for almost a fifth of cancers in males between ages 15 and 34. Other common causes of swellings, lumps, and testicular pain include injuries, inflammation (soreness) from infection, hydrocele, and torsion. These are a few of the reasons to do monthly self-examination of the testicles. The exam only takes minutes and could add years to your life. Get in the habit!   SELF-EXAMINATION OF THE TESTICLES   The testicles are easiest to examine after warm baths or showers and are more difficult to examine when you are cold. This is because the muscles attached to the testicles retract and pull them up higher or into the abdomen. While standing, roll one testicle between the thumb and forefinger. Feel for lumps, swelling, or discomfort. A normal testicle is egg shaped and feels firm. It is smooth and not tender. The spermatic cord can be felt as a firm spaghetti-like cord at the back of the testicle. It is also important to examine your groins. This is the crease between the front of your leg and your abdomen. Also, feel for enlarged lymph nodes (glands). Enlarged nodes are also a cause for you to see your caregiver for evaluation.   Self-examination of the testicles and groin areas on a regular basis will help you to know what your own testicles and groins feel like. This will help you pick up an abnormality (difference) at an earlier stage. Early discovery is the key to curing this cancer or treating other  conditions. Any lump, change, or swelling in the testicle calls for immediate evaluation by your caregiver. Cancer of the testicle does not result in impotence and it does not prevent normal intercourse or prevent having children. If your caregiver feels that medical treatment or chemotherapy could lead to infertility, sperm can be frozen for future use. It is necessary to see a caregiver as soon as possible after the discovery of a lump in a testicle.   Document Released: 05/04/2000 Document Revised: 04/20/2011 Document Reviewed: 01/28/2008   ExitCare® Patient Information ©2014 [employer].

## 2012-08-25 NOTE — Progress Notes (Signed)
  Subjective:    Patient ID: Jeff Massey, male    DOB: 10-14-1980, 32 y.o.   MRN: [id number]  HPI CPX  Past Medical History  Diagnosis Date  . Kidney stones   . Asthma   . Toe fracture    Past Surgical History  Procedure Laterality Date  . Lithotripsy     History   Social History  . Marital Status: Single    Spouse Name: N/A    Number of Children: 0  . Years of Education: N/A   Occupational History  . 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 Smoking status: Never Smoker   . Smokeless tobacco: Never Used  . Alcohol Use: 0.6 oz/week    1 Glasses of wine per week     Comment: social  . Drug Use: No  . Sexually Active: Yes   Other Topics Concern  . Not on file   Social History Narrative   Single, lives w/ girl frined, 3 dogs   Moved from [place] in 2006       Family History  Problem Relation Age of Onset  . Breast cancer Maternal Grandmother   . Cancer Maternal Grandfather   . CAD Neg Hx   . Diabetes Other   . Colon cancer Neg Hx   . Prostate cancer Neg Hx      Review of Systems Diet: Is a regular diet. Exercise, use to do more, currently going to the gym one or twice a week, plans to exercise more. Denies chest pain, shortness or breath. Denies nausea, vomiting, diarrhea or blood in the stools. Denies dysuria, gross hematuria, difficulty urinating.     Objective:   Physical Exam BP 104/75  Pulse 65  Temp(Src) 98.3 F (36.8 C) (Oral)  Ht 5\' 7"  (1.702 m)  Wt 173 lb 12.8 oz (78.835 kg)  BMI 27.21 kg/m2  SpO2 99%  General -- alert, well-developed, NAD  Neck --no thyromegaly Lungs -- normal respiratory effort, no intercostal retractions, no accessory muscle use, and normal breath sounds.   Heart-- normal rate, regular rhythm, no murmur, and no gallop.   Abdomen--soft, non-tender, no distention, no masses, no HSM, no guarding, and no rigidity.   Extremities-- no pretibial edema bilaterally Neurologic--  alert & oriented X3 and strength normal in all extremities. Psych-- Cognition and judgment appear intact. Alert and cooperative with normal attention span and concentration.  not anxious appearing and not depressed appearing.         Assessment & Plan:

## 2012-08-25 NOTE — Assessment & Plan Note (Signed)
Started Lexapro, good compliance and tolerance. Symptoms are definitely improved. We agreed to continue Lexapro, refills sent, will come back in 6 months. In the meantime if he feels he needs to increase the dose he will let me know.

## 2012-08-25 NOTE — Assessment & Plan Note (Signed)
Tdap 2007 Counseled about diet-exercise- STE Labs including STDs

## 2012-08-26 LAB — RPR

## 2012-08-26 LAB — HEPATITIS C ANTIBODY: HCV Ab: NEGATIVE

## 2012-08-26 LAB — HEPATITIS B CORE ANTIBODY, TOTAL: Hep B Core Total Ab: NEGATIVE

## 2012-08-26 LAB — HEPATITIS B SURFACE ANTIGEN: Hepatitis B Surface Ag: NEGATIVE

## 2012-08-30 ENCOUNTER — Telehealth: Payer: Self-pay | Admitting: *Deleted

## 2012-08-30 NOTE — Telephone Encounter (Signed)
Message copied by Shirlee More I on Tue Aug 30, 2012 10:16 AM ------      Message from: Willow Ora E      Created: Sun Aug 28, 2012  9:27 AM       Call patient,      All STDs are negative.      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      Cholesterol used to be better,it increased from 203 to 222, recommend to work on his lifestyle.      The blood sugar test hemoglobin A1c is 5.6,  Needs to recheck yearly. ------

## 2012-08-30 NOTE — Telephone Encounter (Signed)
lmovm to return call  °

## 2012-08-31 ENCOUNTER — Telehealth: Payer: Self-pay | Admitting: *Deleted

## 2012-08-31 NOTE — Telephone Encounter (Signed)
Patient called about recent lab work he had done.  I relayed all Dr. Drue Novel notes for patient and explained about the life style changes patient agreed and started that he will keep his future appointment for further review.

## 2012-08-31 NOTE — Telephone Encounter (Signed)
Discussed with patient, verbalized understanding.  

## 2012-09-15 ENCOUNTER — Encounter: Payer: Self-pay | Admitting: Internal Medicine

## 2012-09-15 NOTE — Telephone Encounter (Signed)
Pt. Preferred pharmacy changed in EPIC. Pt. [facility] contacted to cancel all refills left on his medications at that location. Pt. Encouraged to contact office when he is in need of refills so they can be forwarded to his new pharmacy of choice.

## 2012-10-04 ENCOUNTER — Telehealth: Payer: Self-pay | Admitting: General Practice

## 2012-10-04 ENCOUNTER — Encounter: Payer: Self-pay | Admitting: Internal Medicine

## 2012-10-04 MED ORDER — ESCITALOPRAM OXALATE 10 MG PO TABS: 10.0000 mg | ORAL_TABLET | Freq: Every day | ORAL | Status: DC

## 2012-10-04 NOTE — Telephone Encounter (Signed)
Spoke with pharmacy stated never received the lexapro refill from 08/25/2012. Called in the authorized refill on 10/04/12. JLT

## 2012-10-06 MED ORDER — ESCITALOPRAM OXALATE 10 MG PO TABS: 10.0000 mg | ORAL_TABLET | Freq: Every day | ORAL | Status: DC

## 2012-10-06 NOTE — Telephone Encounter (Signed)
Refilled to [facility]. Other med cancelled.

## 2012-12-07 ENCOUNTER — Encounter: Payer: Self-pay | Admitting: Internal Medicine

## 2012-12-07 ENCOUNTER — Other Ambulatory Visit: Payer: Self-pay | Admitting: Internal Medicine

## 2012-12-07 MED ORDER — ACYCLOVIR 5 % EX OINT: TOPICAL_OINTMENT | CUTANEOUS | Status: DC | PRN

## 2012-12-07 MED ORDER — VALACYCLOVIR HCL 1 G PO TABS: ORAL_TABLET | ORAL | Status: DC

## 2012-12-15 ENCOUNTER — Other Ambulatory Visit: Payer: Self-pay

## 2013-02-27 ENCOUNTER — Ambulatory Visit: Payer: 59 | Admitting: Internal Medicine

## 2013-02-27 DIAGNOSIS — Z0289 Encounter for other administrative examinations: Secondary | ICD-10-CM

## 2013-03-15 ENCOUNTER — Ambulatory Visit (INDEPENDENT_AMBULATORY_CARE_PROVIDER_SITE_OTHER): Payer: 59 | Admitting: Internal Medicine

## 2013-03-15 ENCOUNTER — Encounter: Payer: Self-pay | Admitting: Internal Medicine

## 2013-03-15 VITALS — BP 127/75 | HR 60 | Temp 98.2°F | Wt 176.0 lb

## 2013-03-15 DIAGNOSIS — F329 Major depressive disorder, single episode, unspecified: Secondary | ICD-10-CM

## 2013-03-15 DIAGNOSIS — F3289 Other specified depressive episodes: Secondary | ICD-10-CM

## 2013-03-15 DIAGNOSIS — F32A Depression, unspecified: Secondary | ICD-10-CM

## 2013-03-15 MED ORDER — ESCITALOPRAM OXALATE 10 MG PO TABS: 20.0000 mg | ORAL_TABLET | Freq: Every day | ORAL | Status: DC

## 2013-03-15 NOTE — Progress Notes (Signed)
Pre visit review using our clinic review tool, if applicable. No additional management support is needed unless otherwise documented below in the visit note. 

## 2013-03-15 NOTE — Progress Notes (Signed)
   Subjective:    Patient ID: Jeff Massey, male    DOB: 05/21/1980, 33 y.o.   MRN: [id number]  HPI ROV History of depression, on Lexapro, is working well for him except that still feels irritable from time to time , slightly depressed. Increased dose to 20 mg? Asthma and allergies well-controlled.   Past Medical History  Diagnosis Date  . Kidney stones   . Asthma   . Toe fracture   . Depression 06/17/2012   Past Surgical History  Procedure Laterality Date  . Lithotripsy     History   Social History  . Marital Status: Single    Spouse Name: N/A    Number of Children: 0  . Years of Education: N/A   Occupational History  . 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 Smoking status: Never Smoker   . Smokeless tobacco: Never Used  . Alcohol Use: 0.6 oz/week    1 Glasses of wine per week     Comment: social  . Drug Use: No  . Sexual Activity: Yes   Other Topics Concern  . Not on file   Social History Narrative   Single,  3 dogs   [place] in 2006         Review of Systems Is sleeping well most of the time, occasionally has difficulties he thinks because his schedule at work. No anxiety, no suicidal ideas Pt is living by himself again. Lifestyle is good, he is exercising     Objective:   Physical Exam  BP 127/75  Pulse 60  Temp(Src) 98.2 F (36.8 C)  Wt 176 lb (79.833 kg)  SpO2 99% General -- alert, well-developed, NAD.   Lungs -- normal respiratory effort, no intercostal retractions, no accessory muscle use, and normal breath sounds.  Heart-- normal rate, regular rhythm, no murmur.  Neurologic--  alert & oriented X3. Speech normal, gait normal, strength normal in all extremities.  Psych-- Cognition and judgment appear intact. Cooperative with normal attention span and concentration. No anxious or depressed appearing.      Assessment & Plan:

## 2013-03-15 NOTE — Assessment & Plan Note (Addendum)
Symptoms in general well controlled, still has days that he does not feel 100%. Patient wonders if he could increase to Lexapro 20 mg and it is okay. At some point he had a lack of concentration but that has improved Plan:  Increase Lexapro 20 mg, during the summertime he usually feels better consider  self decreased to 15 mg if patient so desire RTC 6 months

## 2013-03-15 NOTE — Patient Instructions (Signed)
  Next visit is for a physical exam in 6 months , fasting Please make an appointment      

## 2013-05-11 ENCOUNTER — Other Ambulatory Visit: Payer: Self-pay | Admitting: Internal Medicine

## 2013-05-11 MED ORDER — VALACYCLOVIR HCL 1 G PO TABS: ORAL_TABLET | ORAL | Status: DC

## 2013-05-24 ENCOUNTER — Other Ambulatory Visit: Payer: Self-pay

## 2013-05-24 DIAGNOSIS — J309 Allergic rhinitis, unspecified: Secondary | ICD-10-CM

## 2013-05-24 MED ORDER — MOMETASONE FUROATE 50 MCG/ACT NA SUSP: 2.0000 | Freq: Every day | NASAL | Status: DC

## 2013-06-01 ENCOUNTER — Other Ambulatory Visit: Payer: Self-pay | Admitting: Family Medicine

## 2013-06-01 DIAGNOSIS — J309 Allergic rhinitis, unspecified: Secondary | ICD-10-CM

## 2013-07-17 ENCOUNTER — Telehealth: Payer: Self-pay | Admitting: Internal Medicine

## 2013-07-17 NOTE — Telephone Encounter (Signed)
Caller name: Hovanes Fanta  Relation to PX:[id number]  Call back number: [phone] Pharmacy: [facility] [address] [place], [place] [address] [phone]  Reason for call: Patient called to request a refill for Lexapro. Also patient stated that he has a new insurance [employer]) and is now updated in his chart.

## 2013-07-18 ENCOUNTER — Encounter: Payer: Self-pay | Admitting: Internal Medicine

## 2013-07-18 MED ORDER — ESCITALOPRAM OXALATE 10 MG PO TABS: 20.0000 mg | ORAL_TABLET | Freq: Every day | ORAL | Status: DC

## 2013-07-18 NOTE — Telephone Encounter (Signed)
rx sent to pts [facility] pharmacy.  

## 2013-08-29 ENCOUNTER — Encounter: Payer: 59 | Admitting: Internal Medicine

## 2013-11-01 ENCOUNTER — Encounter: Payer: Self-pay | Admitting: Internal Medicine

## 2013-11-02 ENCOUNTER — Other Ambulatory Visit: Payer: Self-pay

## 2013-11-02 MED ORDER — ESCITALOPRAM OXALATE 10 MG PO TABS: 20.0000 mg | ORAL_TABLET | Freq: Every day | ORAL | Status: DC

## 2013-12-19 ENCOUNTER — Encounter: Payer: Self-pay | Admitting: Internal Medicine

## 2013-12-20 ENCOUNTER — Other Ambulatory Visit: Payer: Self-pay

## 2013-12-20 MED ORDER — VALACYCLOVIR HCL 1 G PO TABS: ORAL_TABLET | ORAL | Status: DC

## 2013-12-21 ENCOUNTER — Other Ambulatory Visit: Payer: Self-pay

## 2013-12-21 MED ORDER — ACYCLOVIR 5 % EX OINT: TOPICAL_OINTMENT | CUTANEOUS | Status: AC | PRN

## 2014-02-20 ENCOUNTER — Other Ambulatory Visit: Payer: Self-pay | Admitting: Internal Medicine

## 2014-02-20 ENCOUNTER — Other Ambulatory Visit: Payer: Self-pay

## 2014-02-20 MED ORDER — ESCITALOPRAM OXALATE 10 MG PO TABS: 20.0000 mg | ORAL_TABLET | Freq: Every day | ORAL | Status: DC

## 2014-03-15 ENCOUNTER — Encounter: Payer: Self-pay | Admitting: Internal Medicine

## 2014-03-28 ENCOUNTER — Encounter: Payer: Self-pay | Admitting: Internal Medicine

## 2014-03-28 ENCOUNTER — Ambulatory Visit (INDEPENDENT_AMBULATORY_CARE_PROVIDER_SITE_OTHER): Payer: 59 | Admitting: Internal Medicine

## 2014-03-28 ENCOUNTER — Ambulatory Visit (HOSPITAL_BASED_OUTPATIENT_CLINIC_OR_DEPARTMENT_OTHER)
Admission: RE | Admit: 2014-03-28 | Discharge: 2014-03-28 | Disposition: A | Discharge status: Home / Self Care | Payer: 59 | Source: Ambulatory Visit | Attending: Internal Medicine | Admitting: Internal Medicine

## 2014-03-28 VITALS — BP 132/89 | HR 69 | Temp 98.1°F | Ht 67.0 in | Wt 196.0 lb

## 2014-03-28 DIAGNOSIS — J45909 Unspecified asthma, uncomplicated: Secondary | ICD-10-CM | POA: Diagnosis not present

## 2014-03-28 DIAGNOSIS — Z0000 Encounter for general adult medical examination without abnormal findings: Secondary | ICD-10-CM

## 2014-03-28 DIAGNOSIS — R079 Chest pain, unspecified: Secondary | ICD-10-CM | POA: Insufficient documentation

## 2014-03-28 NOTE — Progress Notes (Signed)
Subjective:    Patient ID: Jeff Massey, male    DOB: 1980/03/25, 34 y.o.   MRN: [id number]  DOS:  03/28/2014 Type of visit - description : cpx  Interval history: Today he reports a one-year history of left-sided, upper chest pain, on and off, at rest, "squeezing feeling", last few seconds, no symptoms when he's stressed or goes to the gym. No associated nausea or diaphoresis. He flies or drives long distances frequently, denies any calf pain or swelling Also may years history of headaches, on and off, usually at the left side, not associated with nausea or neck pain.    Review of Systems Constitutional: No fever, chills. No unexplained wt changes. No unusual sweats HEENT: No dental problems, ear discharge, facial swelling, voice changes. No eye discharge, redness or intolerance to light Respiratory: No wheezing or difficulty breathing. No cough , mucus production Cardiovascular:  no leg swelling or palpitations GI: no nausea, vomiting, diarrhea or abdominal pain.  No blood in the stools. No dysphagia   Endocrine: No polyphagia, polyuria or polydipsia GU: No dysuria, gross hematuria, difficulty urinating. No urinary urgency or frequency. Musculoskeletal: No joint swellings. Aching all over, apparently symptoms started after he stopped exercising. Skin: No change in the color of the skin, palor or rash Allergic, immunologic: No environmental allergies or food allergies Neurological: No dizziness or syncope.   No diplopia, slurred speech, motor deficits, facial numbness Hematological: No enlarged lymph nodes, easy bruising or bleeding Psychiatry: No suicidal ideas, hallucinations, behavior problems or confusion.   Depression under excellent control   Past Medical History  Diagnosis Date  . Kidney stones   . Asthma   . Toe fracture   . Depression 06/17/2012    Past Surgical History  Procedure Laterality Date  . Lithotripsy      History   Social History  . Marital Status:  Single    Spouse Name: N/A  . Number of Children: 0  . Years of Education: N/A   Occupational History  . 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 Smoking status: Never Smoker   . Smokeless tobacco: Never Used  . Alcohol Use: 0.6 oz/week    1 Glasses of wine per week     Comment: social  . Drug Use: No  . Sexual Activity: Yes   Other Topics Concern  . Not on file   Social History Narrative   Single,  3 dogs   Travel nurse , currently in [place]         Family History  Problem Relation Age of Onset  . Breast cancer Maternal Grandmother   . Cancer Maternal Grandfather   . CAD Neg Hx   . Diabetes Other   . Colon cancer Neg Hx   . Prostate cancer Neg Hx        Medication List       This list is accurate as of: 03/28/14 11:59 PM.  Always use your most recent med list.               acyclovir ointment 5 %  Commonly known as:  ZOVIRAX  Apply topically every 3 (three) hours as needed.     albuterol 108 (90 BASE) MCG/ACT inhaler  Commonly known as:  PROVENTIL HFA;VENTOLIN HFA  Inhale 2 puffs into the lungs every 6 (six) hours as needed for wheezing (cough, shortness of breath  or wheezing.).     Cranberry 1000 MG Caps  Take 3 capsules by mouth 2 (two) times daily.     escitalopram 10 MG tablet  Commonly known as:  LEXAPRO  Take 2 tablets (20 mg total) by mouth daily.     fexofenadine 180 MG tablet  Commonly known as:  ALLEGRA  Take 180 mg by mouth daily.     fish oil-omega-3 fatty acids 1000 MG capsule  Take 2 g by mouth daily.     mometasone 50 MCG/ACT nasal spray  Commonly known as:  NASONEX  Place 2 sprays into the nose daily. PATIENT NEEDS OFFICE VISIT FOR ADDITIONAL REFILLS     multivitamin per tablet  Take 1 tablet by mouth daily.     valACYclovir 1000 MG tablet  Commonly known as:  VALTREX  2 tablets by mouth twice a day for 1 day ( total of 4 tablet for each outbreak of fever  blisters)           Objective:   Physical Exam BP 132/89 mmHg  Pulse 69  Temp(Src) 98.1 F (36.7 C) (Oral)  Ht 5\' 7"  (1.702 m)  Wt 196 lb (88.905 kg)  BMI 30.69 kg/m2  SpO2 94% General:   Well developed, well nourished . NAD.  Neck:  Full range of motion. Supple. No  thyromegaly , normal carotid pulse. HEENT:  Normocephalic . Face symmetric, atraumatic Lungs:  CTA B Normal respiratory effort, no intercostal retractions, no accessory muscle use. Heart: RRR,  no murmur.  Abdomen:  Not distended, soft, non-tender. No rebound or rigidity. No mass,organomegaly Muscle skeletal: no pretibial edema bilaterally  Skin: Exposed areas without rash. Not pale. Not jaundice Neurologic:  alert & oriented X3.  Speech normal, gait appropriate for age and unassisted Strength symmetric and appropriate for age.  Psych: Cognition and judgment appear intact.  Cooperative with normal attention span and concentration.  Behavior appropriate. No anxious or depressed appearing.       Assessment & Plan:

## 2014-03-28 NOTE — Progress Notes (Signed)
Pre visit review using our clinic review tool, if applicable. No additional management support is needed unless otherwise documented below in the visit note. 

## 2014-03-28 NOTE — Assessment & Plan Note (Addendum)
Tdap 2007 Counseled about diet-exercise- STE Labs   Other issues: Chest pain: No family history of CAD, no diabetes, hypertension. Last cholesterol was slightly elevated. He is a frequent flyer but denies any calf pain or swelling. EKG  normal sinus rhythm. We'll get a chest x-ray, if negative will recommend observation. Watch closely for swelling and increased chest pain after prolonged trips. Aches and pains: States that symptoms started after she stopped going to the gym. Unclear etiology, for completeness  will check a sedimentation rate and total CKs Depression under excellent control with Lexapro Chronic headaches, responding well to Tylenol, no change for now. Watch for triggers.

## 2014-03-28 NOTE — Patient Instructions (Signed)
Get your blood work before you leave   Stop by the first floor and get the XR   If the chest pain change in any way, get worse or develops calf pain or swelling: Please call the office  Please come back to the office in 1 year  for a physical exam. Come back fasting

## 2014-03-29 ENCOUNTER — Other Ambulatory Visit: Payer: Self-pay | Admitting: Internal Medicine

## 2014-03-29 ENCOUNTER — Ambulatory Visit (HOSPITAL_BASED_OUTPATIENT_CLINIC_OR_DEPARTMENT_OTHER)
Admission: RE | Admit: 2014-03-29 | Discharge: 2014-03-29 | Disposition: A | Discharge status: Home / Self Care | Payer: 59 | Source: Ambulatory Visit | Attending: Internal Medicine | Admitting: Internal Medicine

## 2014-03-29 ENCOUNTER — Other Ambulatory Visit: Payer: Self-pay

## 2014-03-29 DIAGNOSIS — R911 Solitary pulmonary nodule: Secondary | ICD-10-CM | POA: Diagnosis present

## 2014-03-29 DIAGNOSIS — R0602 Shortness of breath: Secondary | ICD-10-CM | POA: Diagnosis not present

## 2014-03-29 DIAGNOSIS — R079 Chest pain, unspecified: Secondary | ICD-10-CM | POA: Insufficient documentation

## 2014-03-29 LAB — COMPREHENSIVE METABOLIC PANEL
ALT: 137 U/L — AB (ref 0–53)
AST: 79 U/L — AB (ref 0–37)
Albumin: 4.7 g/dL (ref 3.5–5.2)
Alkaline Phosphatase: 67 U/L (ref 39–117)
BUN: 21 mg/dL (ref 6–23)
CO2: 24 meq/L (ref 19–32)
CREATININE: 1.19 mg/dL (ref 0.40–1.50)
Calcium: 9.6 mg/dL (ref 8.4–10.5)
Chloride: 104 mEq/L (ref 96–112)
GFR: 74.33 mL/min (ref >60.00)
Glucose, Bld: 98 mg/dL (ref 70–99)
Potassium: 4 mEq/L (ref 3.5–5.1)
SODIUM: 137 meq/L (ref 135–145)
TOTAL PROTEIN: 7.8 g/dL (ref 6.0–8.3)
Total Bilirubin: 1 mg/dL (ref 0.2–1.2)

## 2014-03-29 LAB — RPR

## 2014-03-29 LAB — CBC WITH DIFFERENTIAL/PLATELET
BASOS PCT: 0.4 % (ref 0.0–3.0)
Basophils Absolute: 0 10*3/uL (ref 0.0–0.1)
Eosinophils Absolute: 0.1 10*3/uL (ref 0.0–0.7)
Eosinophils Relative: 1.7 % (ref 0.0–5.0)
HCT: 45.2 % (ref 39.0–52.0)
HEMOGLOBIN: 15.5 g/dL (ref 13.0–17.0)
Lymphocytes Relative: 34.8 % (ref 12.0–46.0)
Lymphs Abs: 1.5 10*3/uL (ref 0.7–4.0)
MCHC: 34.4 g/dL (ref 30.0–36.0)
MCV: 85.5 fl (ref 78.0–100.0)
MONOS PCT: 8.7 % (ref 3.0–12.0)
Monocytes Absolute: 0.4 10*3/uL (ref 0.1–1.0)
NEUTROS ABS: 2.3 10*3/uL (ref 1.4–7.7)
Neutrophils Relative %: 54.4 % (ref 43.0–77.0)
Platelets: 286 10*3/uL (ref 150.0–400.0)
RBC: 5.29 Mil/uL (ref 4.22–5.81)
RDW: 13.3 % (ref 11.5–15.5)
WBC: 4.3 10*3/uL (ref 4.0–10.5)

## 2014-03-29 LAB — LIPID PANEL
Cholesterol: 279 mg/dL — ABNORMAL HIGH (ref 0–200)
HDL: 42.2 mg/dL (ref >39.00)
LDL Cholesterol: 205 mg/dL — ABNORMAL HIGH (ref 0–99)
NonHDL: 236.8
Total CHOL/HDL Ratio: 7
Triglycerides: 160 mg/dL — ABNORMAL HIGH (ref 0.0–149.0)
VLDL: 32 mg/dL (ref 0.0–40.0)

## 2014-03-29 LAB — CREATININE KINASE MB: CK-MB: 2.5 ng/mL (ref 0.3–4.0)

## 2014-03-29 LAB — TSH: TSH: 1.02 u[IU]/mL (ref 0.35–4.50)

## 2014-03-29 LAB — SEDIMENTATION RATE: Sed Rate: 6 mm/hr (ref 0–22)

## 2014-03-29 LAB — VITAMIN D 25 HYDROXY (VIT D DEFICIENCY, FRACTURES): VITD: 22.01 ng/mL — AB (ref 30.00–100.00)

## 2014-03-29 LAB — HIV ANTIBODY (ROUTINE TESTING W REFLEX): HIV 1&2 Ab, 4th Generation: NONREACTIVE

## 2014-04-03 ENCOUNTER — Other Ambulatory Visit: Payer: Self-pay

## 2014-04-03 ENCOUNTER — Encounter: Payer: Self-pay | Admitting: Internal Medicine

## 2014-04-03 MED ORDER — VITAMIN D (ERGOCALCIFEROL) 1.25 MG (50000 UNIT) PO CAPS: 50000.0000 [IU] | ORAL_CAPSULE | ORAL | Status: DC

## 2014-04-30 ENCOUNTER — Other Ambulatory Visit: Payer: Self-pay | Admitting: Family Medicine

## 2014-05-02 ENCOUNTER — Encounter: Payer: Self-pay | Admitting: Internal Medicine

## 2014-05-03 ENCOUNTER — Other Ambulatory Visit: Payer: Self-pay

## 2014-05-03 DIAGNOSIS — J309 Allergic rhinitis, unspecified: Secondary | ICD-10-CM

## 2014-05-03 MED ORDER — MOMETASONE FUROATE 50 MCG/ACT NA SUSP: 2.0000 | Freq: Every day | NASAL | Status: DC

## 2014-05-27 IMAGING — CR DG CHEST 2V
2 series · 2 of 2 positions shown · non-contrast
Comparison: None.

CLINICAL DATA: Shortness of breath.

CHEST - 2 VIEW

[PA]
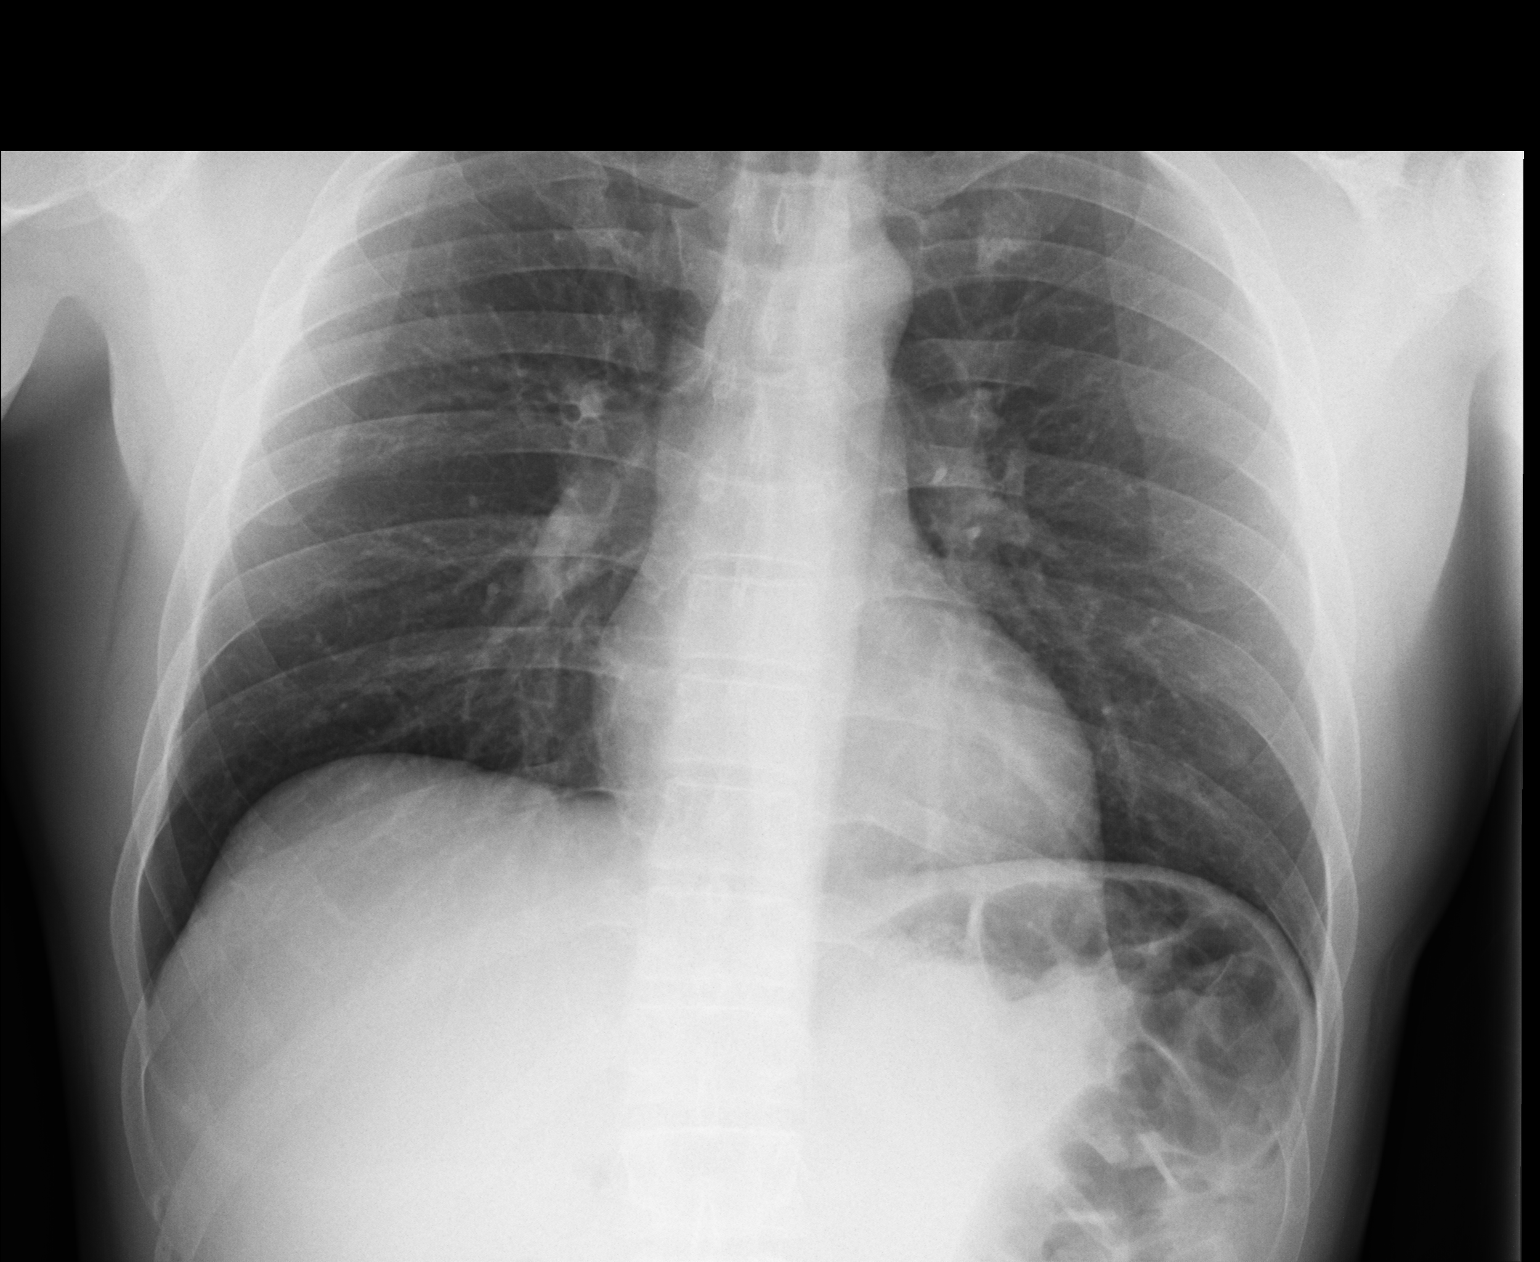

[lateral]
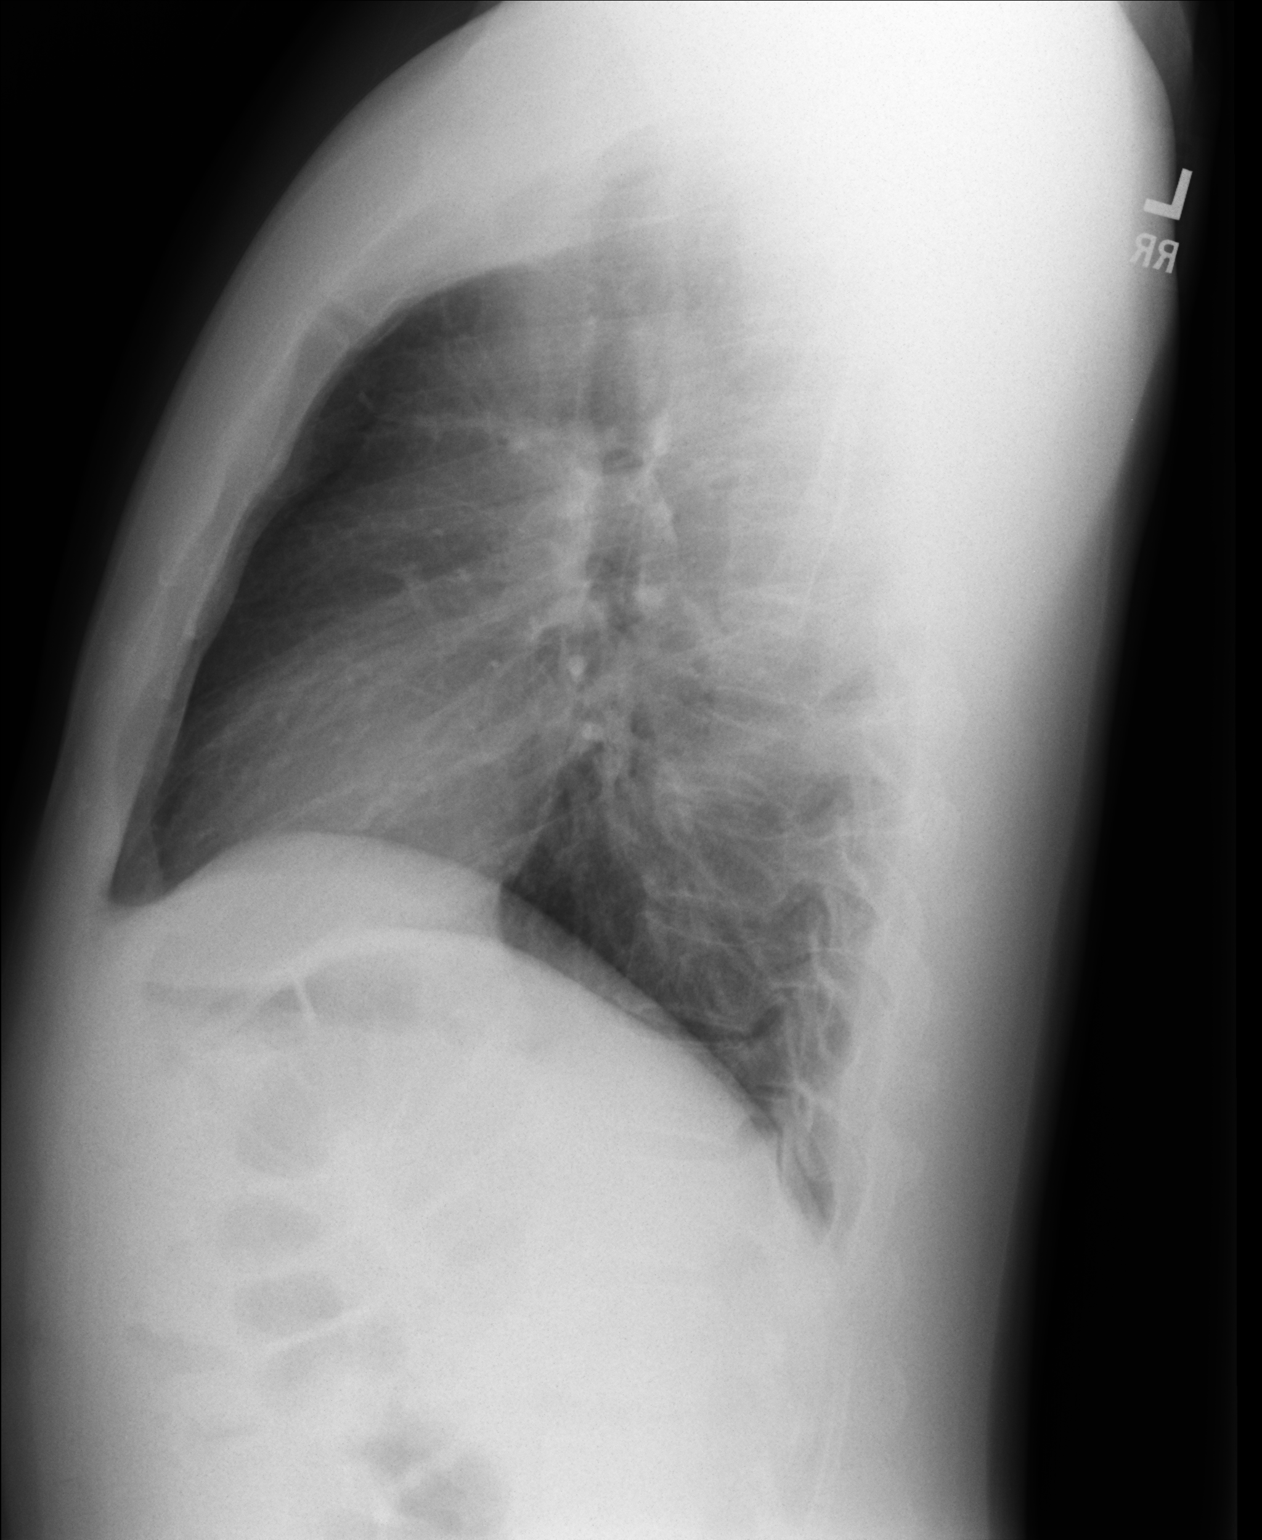

[2 of 2 positions shown; findings below may reference images not displayed]

FINDINGS: Heart size and vascularity are normal and the lungs are
clear.  No osseous abnormality.
IMPRESSION: Normal chest.

## 2014-06-17 ENCOUNTER — Other Ambulatory Visit: Payer: Self-pay | Admitting: Internal Medicine

## 2014-06-18 ENCOUNTER — Other Ambulatory Visit: Payer: Self-pay

## 2014-06-18 MED ORDER — ESCITALOPRAM OXALATE 10 MG PO TABS: 20.0000 mg | ORAL_TABLET | Freq: Every day | ORAL | Status: DC

## 2014-07-24 ENCOUNTER — Other Ambulatory Visit: Payer: Self-pay

## 2014-07-24 ENCOUNTER — Encounter: Payer: Self-pay | Admitting: Internal Medicine

## 2014-07-24 MED ORDER — ESCITALOPRAM OXALATE 10 MG PO TABS: 20.0000 mg | ORAL_TABLET | Freq: Every day | ORAL | Status: DC

## 2014-08-08 ENCOUNTER — Ambulatory Visit (INDEPENDENT_AMBULATORY_CARE_PROVIDER_SITE_OTHER): Payer: No Typology Code available for payment source | Admitting: Internal Medicine

## 2014-08-08 ENCOUNTER — Encounter: Payer: Self-pay | Admitting: Internal Medicine

## 2014-08-08 VITALS — BP 122/64 | HR 64 | Temp 98.2°F | Ht 67.0 in | Wt 180.4 lb

## 2014-08-08 DIAGNOSIS — R7989 Other specified abnormal findings of blood chemistry: Secondary | ICD-10-CM

## 2014-08-08 DIAGNOSIS — R5382 Chronic fatigue, unspecified: Secondary | ICD-10-CM | POA: Diagnosis not present

## 2014-08-08 DIAGNOSIS — R5383 Other fatigue: Secondary | ICD-10-CM | POA: Insufficient documentation

## 2014-08-08 DIAGNOSIS — E559 Vitamin D deficiency, unspecified: Secondary | ICD-10-CM | POA: Insufficient documentation

## 2014-08-08 DIAGNOSIS — R945 Abnormal results of liver function studies: Secondary | ICD-10-CM

## 2014-08-08 LAB — HEPATIC FUNCTION PANEL
ALT: 34 U/L (ref 0–53)
AST: 30 U/L (ref 0–37)
Albumin: 4.6 g/dL (ref 3.5–5.2)
Alkaline Phosphatase: 55 U/L (ref 39–117)
BILIRUBIN DIRECT: 0.1 mg/dL (ref 0.0–0.3)
BILIRUBIN TOTAL: 0.6 mg/dL (ref 0.2–1.2)
Total Protein: 7.6 g/dL (ref 6.0–8.3)

## 2014-08-08 LAB — VITAMIN D 25 HYDROXY (VIT D DEFICIENCY, FRACTURES): VITD: 20.4 ng/mL — ABNORMAL LOW (ref 30.00–100.00)

## 2014-08-08 LAB — VITAMIN B12: Vitamin B-12: 900 pg/mL (ref 211–911)

## 2014-08-08 LAB — FOLATE: Folate: >24.8 ng/mL (ref >5.9)

## 2014-08-08 NOTE — Progress Notes (Signed)
Subjective:    Patient ID: Jeff Massey, male    DOB: 1980/05/12, 34 y.o.   MRN: [id number]  DOS:  08/08/2014 Type of visit - description : rov Interval history: Low vitamin D, status post ergocalciferol, now on OTCs Last LFTs elevated, does not recall taking any alcohol or Tylenol at the time. No recent intake of such recently. Complaining of lack of energy, feeling somewhat sleepy and unmotivated. When asked, admits to snoring.   Review of Systems  No nausea, vomiting, diarrhea On Lexapro, mood well controlled Admits to mild decrease of libido, no ED   Past Medical History  Diagnosis Date  . Kidney stones   . Asthma   . Toe fracture   . Depression 06/17/2012    Past Surgical History  Procedure Laterality Date  . Lithotripsy      History   Social History  . Marital Status: Single    Spouse Name: N/A  . Number of Children: 0  . Years of Education: N/A   Occupational History  . 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 Smoking status: Never Smoker   . Smokeless tobacco: Never Used  . Alcohol Use: 0.6 oz/week    1 Glasses of wine per week     Comment: social  . Drug Use: No  . Sexual Activity: Yes   Other Topics Concern  . Not on file   Social History Narrative   Single,  3 dogs   Travel nurse , currently in [place]            Medication List       This list is accurate as of: 08/08/14 11:59 PM.  Always use your most recent med list.               acyclovir ointment 5 %  Commonly known as:  ZOVIRAX  Apply topically every 3 (three) hours as needed.     albuterol 108 (90 BASE) MCG/ACT inhaler  Commonly known as:  PROVENTIL HFA;VENTOLIN HFA  Inhale 2 puffs into the lungs every 6 (six) hours as needed for wheezing (cough, shortness of breath or wheezing.).     Cranberry 1000 MG Caps  Take 3 capsules by mouth 2 (two) times daily.     escitalopram 10 MG tablet  Commonly known as:   LEXAPRO  Take 2 tablets (20 mg total) by mouth daily.     fexofenadine 180 MG tablet  Commonly known as:  ALLEGRA  Take 180 mg by mouth daily.     fish oil-omega-3 fatty acids 1000 MG capsule  Take 2 g by mouth daily.     mometasone 50 MCG/ACT nasal spray  Commonly known as:  NASONEX  Place 2 sprays into the nose daily.     multivitamin per tablet  Take 1 tablet by mouth daily.     valACYclovir 1000 MG tablet  Commonly known as:  VALTREX  2 tablets by mouth twice a day for 1 day ( total of 4 tablet for each outbreak of fever blisters)           Objective:   Physical Exam BP 122/64 mmHg  Pulse 64  Temp(Src) 98.2 F (36.8 C) (Oral)  Ht  (1.702 m)  Wt 180 lb 6 oz (81.818 kg)  BMI 28.24 kg/m2  SpO2 97% General:   Well developed, well nourished . NAD.  HEENT:  Normocephalic . Face symmetric, atraumatic Skin: Not pale. Not jaundice Neurologic:  alert & oriented X3.  Speech normal, gait appropriate for age and unassisted Psych--  Cognition and judgment appear intact.  Cooperative with normal attention span and concentration.  Behavior appropriate. No anxious or depressed appearing.        Assessment & Plan:     Increase LFTs, Not taking Tylenol or excesses EtOH Chart reviewed, hepatitis B and C serology 08-2012 negative Plan: Recheck.

## 2014-08-08 NOTE — Progress Notes (Signed)
Pre visit review using our clinic review tool, if applicable. No additional management support is needed unless otherwise documented below in the visit note. 

## 2014-08-08 NOTE — Patient Instructions (Signed)
Please go to the lab Lab, please hold a Rainbow   You need to be tested for sleep apnea  Come back after you return from [place]

## 2014-08-09 LAB — TESTOSTERONE, FREE, TOTAL, SHBG
Sex Hormone Binding: 35 nmol/L (ref 10–50)
TESTOSTERONE-% FREE: 1.9 % (ref 1.6–2.9)
Testosterone, Free: 73.6 pg/mL (ref 47.0–244.0)
Testosterone: 378 ng/dL (ref 300–890)

## 2014-08-09 MED ORDER — VITAMIN D (ERGOCALCIFEROL) 1.25 MG (50000 UNIT) PO CAPS: 50000.0000 [IU] | ORAL_CAPSULE | ORAL | Status: DC

## 2014-08-09 NOTE — Assessment & Plan Note (Signed)
Status post ergocalciferol, recheck labs

## 2014-08-09 NOTE — Addendum Note (Signed)
Addended by: Dorette GrateFAULKNER, Kymberlie Brazeau C on: 08/09/2014 01:36 PM   Modules accepted: Orders

## 2014-08-09 NOTE — Assessment & Plan Note (Signed)
Lack of energy Request a testosterone checked which will be done. Epworth sleepiness scale score 17, very high, he  snores, needs evaluation for sleep apnea. He is leaving to work in  [place] for the next 3 months, recommend to get that checked over there.

## 2014-09-05 ENCOUNTER — Other Ambulatory Visit: Payer: Self-pay

## 2014-09-05 MED ORDER — ESCITALOPRAM OXALATE 10 MG PO TABS: 20.0000 mg | ORAL_TABLET | Freq: Every day | ORAL | Status: DC

## 2014-10-17 ENCOUNTER — Other Ambulatory Visit: Payer: Self-pay

## 2014-10-17 ENCOUNTER — Encounter: Payer: Self-pay | Admitting: Internal Medicine

## 2014-10-17 MED ORDER — ESCITALOPRAM OXALATE 10 MG PO TABS: 20.0000 mg | ORAL_TABLET | Freq: Every day | ORAL | Status: DC

## 2015-03-03 ENCOUNTER — Other Ambulatory Visit: Payer: Self-pay | Admitting: Internal Medicine

## 2015-03-04 MED ORDER — VALACYCLOVIR HCL 1 G PO TABS: ORAL_TABLET | ORAL | Status: AC

## 2015-03-04 NOTE — Telephone Encounter (Signed)
Rx sent 

## 2015-05-31 ENCOUNTER — Other Ambulatory Visit: Payer: Self-pay

## 2015-05-31 MED ORDER — ESCITALOPRAM OXALATE 10 MG PO TABS: 20.0000 mg | ORAL_TABLET | Freq: Every day | ORAL | Status: DC

## 2015-06-06 ENCOUNTER — Encounter: Payer: Self-pay | Admitting: *Deleted

## 2015-06-06 ENCOUNTER — Telehealth: Payer: Self-pay | Admitting: *Deleted

## 2015-06-06 NOTE — Telephone Encounter (Signed)
Pre-Visit Call completed with patient and chart updated.   Pre-Visit Info documented in Specialty Comments under SnapShot.    

## 2015-06-07 ENCOUNTER — Other Ambulatory Visit: Payer: Self-pay

## 2015-06-07 ENCOUNTER — Encounter: Payer: Self-pay | Admitting: Internal Medicine

## 2015-06-07 ENCOUNTER — Ambulatory Visit (INDEPENDENT_AMBULATORY_CARE_PROVIDER_SITE_OTHER): Payer: 59 | Admitting: Internal Medicine

## 2015-06-07 VITALS — HR 74 | Temp 97.8°F | Ht 67.0 in | Wt 196.2 lb

## 2015-06-07 DIAGNOSIS — Z0000 Encounter for general adult medical examination without abnormal findings: Secondary | ICD-10-CM | POA: Diagnosis not present

## 2015-06-07 DIAGNOSIS — Z09 Encounter for follow-up examination after completed treatment for conditions other than malignant neoplasm: Secondary | ICD-10-CM

## 2015-06-07 DIAGNOSIS — J309 Allergic rhinitis, unspecified: Secondary | ICD-10-CM

## 2015-06-07 LAB — CBC WITH DIFFERENTIAL/PLATELET
BASOS PCT: 0.6 % (ref 0.0–3.0)
Basophils Absolute: 0 10*3/uL (ref 0.0–0.1)
EOS ABS: 0.1 10*3/uL (ref 0.0–0.7)
EOS PCT: 2.3 % (ref 0.0–5.0)
HEMATOCRIT: 45.2 % (ref 39.0–52.0)
HEMOGLOBIN: 15.6 g/dL (ref 13.0–17.0)
LYMPHS PCT: 29.6 % (ref 12.0–46.0)
Lymphs Abs: 1.2 10*3/uL (ref 0.7–4.0)
MCHC: 34.4 g/dL (ref 30.0–36.0)
MCV: 85.2 fl (ref 78.0–100.0)
MONO ABS: 0.4 10*3/uL (ref 0.1–1.0)
Monocytes Relative: 10.7 % (ref 3.0–12.0)
NEUTROS ABS: 2.4 10*3/uL (ref 1.4–7.7)
Neutrophils Relative %: 56.8 % (ref 43.0–77.0)
PLATELETS: 306 10*3/uL (ref 150.0–400.0)
RBC: 5.31 Mil/uL (ref 4.22–5.81)
RDW: 13 % (ref 11.5–15.5)
WBC: 4.2 10*3/uL (ref 4.0–10.5)

## 2015-06-07 LAB — LDL CHOLESTEROL, DIRECT: Direct LDL: 164 mg/dL

## 2015-06-07 LAB — COMPREHENSIVE METABOLIC PANEL
ALBUMIN: 4.5 g/dL (ref 3.5–5.2)
ALT: 74 U/L — ABNORMAL HIGH (ref 0–53)
AST: 48 U/L — AB (ref 0–37)
Alkaline Phosphatase: 58 U/L (ref 39–117)
BUN: 21 mg/dL (ref 6–23)
CHLORIDE: 102 meq/L (ref 96–112)
CO2: 28 meq/L (ref 19–32)
CREATININE: 1.04 mg/dL (ref 0.40–1.50)
Calcium: 9.5 mg/dL (ref 8.4–10.5)
GFR: 86.23 mL/min (ref >60.00)
Glucose, Bld: 115 mg/dL — ABNORMAL HIGH (ref 70–99)
POTASSIUM: 3.9 meq/L (ref 3.5–5.1)
SODIUM: 136 meq/L (ref 135–145)
Total Bilirubin: 0.7 mg/dL (ref 0.2–1.2)
Total Protein: 7.6 g/dL (ref 6.0–8.3)

## 2015-06-07 LAB — LIPID PANEL
CHOL/HDL RATIO: 7
Cholesterol: 274 mg/dL — ABNORMAL HIGH (ref 0–200)
HDL: 38.2 mg/dL — ABNORMAL LOW (ref >39.00)
NonHDL: 235.78
Triglycerides: 214 mg/dL — ABNORMAL HIGH (ref 0.0–149.0)
VLDL: 42.8 mg/dL — AB (ref 0.0–40.0)

## 2015-06-07 LAB — TSH: TSH: 0.75 u[IU]/mL (ref 0.35–4.50)

## 2015-06-07 LAB — VITAMIN D 25 HYDROXY (VIT D DEFICIENCY, FRACTURES): VITD: 48.31 ng/mL (ref 30.00–100.00)

## 2015-06-07 MED ORDER — MOMETASONE FUROATE 50 MCG/ACT NA SUSP: 2.0000 | Freq: Every day | NASAL | Status: DC

## 2015-06-07 NOTE — Assessment & Plan Note (Addendum)
Tdap 2007  diet-exercise- not good lately, plans to go back on a healthier lifestyle He does practice STE  Labs : Patient request extensive labs. Will do CMP, CBC, FLP, TSH, testosterone, vitamin D, HSV, HIV, RPR

## 2015-06-07 NOTE — Progress Notes (Signed)
Pre visit review using our clinic tool,if applicable. No additional management support is needed unless otherwise documented below in the visit note.  

## 2015-06-07 NOTE — Patient Instructions (Signed)
GO TO THE LAB :      Get the blood work     GO TO THE FRONT DESK Schedule your next appointment for a  Complete physical in 1 year

## 2015-06-07 NOTE — Progress Notes (Signed)
Subjective:    Patient ID: Jeff Massey, male    DOB: 08/30/1980, 35 y.o.   MRN: [id number]  DOS:  06/07/2015 Type of visit - description : CPX Interval history: No concerns. He was living in [place] for 9 months, has not been eating healthy, has gained weight.   Review of Systems Constitutional: No fever. No chills. No unexplained wt changes. No unusual sweats  HEENT: No dental problems, no ear discharge, no facial swelling, no voice changes. No eye discharge, no eye  redness , no  intolerance to light   Respiratory: No wheezing , no  difficulty breathing. No cough , no mucus production  Cardiovascular: No CP, no leg swelling , no  Palpitations  GI: no nausea, no vomiting, no diarrhea , no  abdominal pain.  No blood in the stools. No dysphagia, no odynophagia    Endocrine: No polyphagia, no polyuria , no polydipsia  GU: No dysuria, gross hematuria, difficulty urinating. No urinary urgency, no frequency.  Musculoskeletal: No joint swellings or unusual aches or pains  Skin: No change in the color of the skin, palor , no  Rash  Allergic, immunologic: No environmental allergies , no  food allergies  Neurological: No dizziness no  syncope. No headaches. No diplopia, no slurred, no slurred speech, no motor deficits, no facial  Numbness  Hematological: No enlarged lymph nodes, no easy bruising , no unusual bleedings  Psychiatry: No suicidal ideas, no hallucinations, no beavior problems, no confusion.  No unusual/severe anxiety, no depression   Past Medical History  Diagnosis Date  . Kidney stones   . Asthma   . Toe fracture   . Depression 06/17/2012  . Vitamin D deficiency     Past Surgical History  Procedure Laterality Date  . Lithotripsy      Social History   Social History  . Marital Status: Single    Spouse Name: N/A  . Number of Children: 0  . Years of Education: N/A   Occupational History  . 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 Smoking status: Never Smoker   . Smokeless tobacco: Never Used  . Alcohol Use: 0.6 oz/week    1 Glasses of wine per week     Comment: social  . Drug Use: No  . Sexual Activity: Yes   Other Topics Concern  . Not on file   Social History Narrative   Single,  2 dogs   Travel nurse           Family History  Problem Relation Age of Onset  . Breast cancer Maternal Grandmother   . Cancer Maternal Grandfather   . CAD Neg Hx   . Diabetes Other   . Colon cancer Neg Hx   . Prostate cancer Neg Hx        Medication List       This list is accurate as of: 06/07/15 11:59 PM.  Always use your most recent med list.               acyclovir ointment 5 %  Commonly known as:  ZOVIRAX  Apply topically every 3 (three) hours as needed.     albuterol 108 (90 Base) MCG/ACT inhaler  Commonly known as:  PROVENTIL HFA;VENTOLIN HFA  Inhale 2 puffs into the lungs every 6 (six) hours as needed for wheezing (cough, shortness of breath or wheezing.).  escitalopram 10 MG tablet  Commonly known as:  LEXAPRO  Take 2 tablets (20 mg total) by mouth daily.     fish oil-omega-3 fatty acids 1000 MG capsule  Take 2 g by mouth daily.     mometasone 50 MCG/ACT nasal spray  Commonly known as:  NASONEX  Place 2 sprays into the nose daily.     multivitamin per tablet  Take 1 tablet by mouth daily.     valACYclovir 1000 MG tablet  Commonly known as:  VALTREX  2 tablets by mouth twice a day for 1 day ( total of 4 tablet for each outbreak of fever blisters)     VITAMIN D PO  Take by mouth 2 (two) times daily.           Objective:   Physical Exam Pulse 74  Temp(Src) 97.8 F (36.6 C) (Oral)  Ht  (1.702 m)  Wt 196 lb 3.2 oz (88.996 kg)  BMI 30.72 kg/m2  SpO2 98%  General:   Well developed, well nourished . NAD.  Neck: No  Thyromegaly  HEENT:  Normocephalic . Face symmetric, atraumatic Lungs:  CTA B Normal respiratory effort, no  intercostal retractions, no accessory muscle use. Heart: RRR,  no murmur.  No pretibial edema bilaterally  Abdomen:  Not distended, soft, non-tender. No rebound or rigidity.   Skin: Exposed areas without rash. Not pale. Not jaundice Neurologic:  alert & oriented X3.  Speech normal, gait appropriate for age and unassisted Strength symmetric and appropriate for age.  Psych: Cognition and judgment appear intact.  Cooperative with normal attention span and concentration.  Behavior appropriate. No anxious or depressed appearing.    Assessment & Plan:   Assessment Asthma Depression 2013 Cold sores  Kidney stones, lithotripsy hepatitis B and C serology 08-2012 negative Low  vitamin D    PLAN   asthma: Well controlled, has not used inhalers in several months Depression: Controlled, will call for refills as needed Vitamin D deficiency: On OTC vitamin D qd RTC one year

## 2015-06-08 DIAGNOSIS — Z09 Encounter for follow-up examination after completed treatment for conditions other than malignant neoplasm: Secondary | ICD-10-CM | POA: Insufficient documentation

## 2015-06-08 LAB — RPR

## 2015-06-08 LAB — HIV ANTIBODY (ROUTINE TESTING W REFLEX): HIV: NONREACTIVE

## 2015-06-08 NOTE — Assessment & Plan Note (Signed)
asthma: Well controlled, has not used inhalers in several months Depression: Controlled, will call for refills as needed Vitamin D deficiency: On OTC vitamin D qd RTC one year

## 2015-06-10 LAB — HSV(HERPES SIMPLEX VRS) I + II AB-IGG
HSV 1 Glycoprotein G Ab, IgG: 48.8 Index — ABNORMAL HIGH (ref <0.90)
HSV 2 Glycoprotein G Ab, IgG: <0.9 Index (ref <0.90)

## 2015-06-12 ENCOUNTER — Encounter: Payer: Self-pay | Admitting: Internal Medicine

## 2015-06-13 ENCOUNTER — Other Ambulatory Visit: Payer: Self-pay | Admitting: Internal Medicine

## 2015-06-13 DIAGNOSIS — J309 Allergic rhinitis, unspecified: Secondary | ICD-10-CM

## 2015-06-13 MED ORDER — MOMETASONE FUROATE 50 MCG/ACT NA SUSP: 2.0000 | Freq: Every day | NASAL | Status: DC

## 2015-06-17 ENCOUNTER — Telehealth: Payer: Self-pay

## 2015-06-17 MED ORDER — FLUTICASONE PROPIONATE 50 MCG/ACT NA SUSP: 2.0000 | Freq: Every day | NASAL | Status: AC

## 2015-06-17 NOTE — Telephone Encounter (Signed)
We can try Flonase 2 sprays on each side of the nose daily, one year supply

## 2015-06-17 NOTE — Telephone Encounter (Signed)
Received fax from [facility], Mometasone Furoate 50 mcg spry is not covered by insurance, Pt requesting alternative. Please advise.

## 2015-06-17 NOTE — Telephone Encounter (Signed)
[facility]onase sent to [facility], Nasonex d/c.

## 2015-06-18 ENCOUNTER — Other Ambulatory Visit: Payer: Self-pay | Admitting: *Deleted

## 2015-06-18 ENCOUNTER — Telehealth: Payer: Self-pay | Admitting: *Deleted

## 2015-06-18 DIAGNOSIS — E7800 Pure hypercholesterolemia, unspecified: Secondary | ICD-10-CM

## 2015-06-18 DIAGNOSIS — R945 Abnormal results of liver function studies: Secondary | ICD-10-CM

## 2015-06-18 DIAGNOSIS — R7989 Other specified abnormal findings of blood chemistry: Secondary | ICD-10-CM

## 2015-06-18 DIAGNOSIS — R739 Hyperglycemia, unspecified: Secondary | ICD-10-CM

## 2015-06-18 NOTE — Telephone Encounter (Signed)
-----   Message from Wanda PlumpJose E Paz, MD sent at 06/12/2015 11:51 AM EDT ----- Regarding: Enter future orders To be done in 2 months: FLP -- dx  high cholesterol AST ALT -- dx  elevated LFTs A1c --- dx  hyperglycemia

## 2015-06-18 NOTE — Telephone Encounter (Signed)
Future labs ordered and sent.//AB/CMA 

## 2015-07-12 ENCOUNTER — Encounter: Payer: Self-pay | Admitting: Internal Medicine

## 2015-07-15 NOTE — Telephone Encounter (Signed)
Letter printed and mailed to Pt at:  [address] [place], [place] [id number]

## 2015-10-10 ENCOUNTER — Other Ambulatory Visit: Payer: Self-pay | Admitting: Internal Medicine

## 2016-01-15 ENCOUNTER — Ambulatory Visit (INDEPENDENT_AMBULATORY_CARE_PROVIDER_SITE_OTHER): Payer: 59 | Admitting: Internal Medicine

## 2016-01-15 ENCOUNTER — Encounter: Payer: Self-pay | Admitting: Internal Medicine

## 2016-01-15 ENCOUNTER — Ambulatory Visit (HOSPITAL_BASED_OUTPATIENT_CLINIC_OR_DEPARTMENT_OTHER)
Admission: RE | Admit: 2016-01-15 | Discharge: 2016-01-15 | Disposition: A | Discharge status: Home / Self Care | Payer: 59 | Source: Ambulatory Visit | Attending: Internal Medicine | Admitting: Internal Medicine

## 2016-01-15 VITALS — BP 118/72 | HR 78 | Temp 98.0°F | Resp 14 | Ht 67.0 in | Wt 204.5 lb

## 2016-01-15 DIAGNOSIS — R51 Headache: Secondary | ICD-10-CM | POA: Diagnosis not present

## 2016-01-15 DIAGNOSIS — R519 Headache, unspecified: Secondary | ICD-10-CM

## 2016-01-15 NOTE — Patient Instructions (Signed)
Please go to the first floor, we ordered a CT of the head stat.  Once you go back to [place], get established with a new primary doctor or neurologist. Most likely need a medication called Topamax for migraine headaches.  Try to see what is triggering the headaches if anything.  For the next episode: Rest, drink plenty of fluids, take Motrin 200 mg 3 tablets immediately.  If you have any  usually severe or persistent headache: Go to the ER

## 2016-01-15 NOTE — Progress Notes (Signed)
Subjective:    Patient ID: Jeff Massey, male    DOB: 10/24/1980, 35 y.o.   MRN: [id number]  DOS:  01/15/2016 Type of visit - description : Acute visit Interval history:  Patient has a history of mild headaches now and then, for the last 6 or 7 months symptoms have increase: Having a headache once or twice a week, randomly, intensity is sometimes as high as 7/10, may last 1 to 3 hours, in the past excedrin helped but this time is not helping as much. Few times symptoms were associated with nausea but no vomiting . During the episodes, he is more sensitive to lights and noises.   Review of Systems No e fever, chills or upper respiratory symptoms No head injury No neck stiffness Stress is at baseline although he will be a father in few months, he is very happy about it.   Past Medical History:  Diagnosis Date  . Asthma   . Depression 06/17/2012  . Kidney stones   . Toe fracture   . Vitamin D deficiency     Past Surgical History:  Procedure Laterality Date  . LITHOTRIPSY      Social History   Social History  . Marital status: Single    Spouse name: N/A  . Number of children: 0  . Years of education: N/A   Occupational History  . 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 Smoking status: Never Smoker  . Smokeless tobacco: Never Used  . Alcohol use 0.6 oz/week    1 Glasses of wine per week     Comment: social  . Drug use: No  . Sexual activity: Yes   Other Topics Concern  . Not on file   Social History Narrative   Has a steady girlfriend,   2 dogs   Travel nurse              Medication List       Accurate as of 01/15/16 11:59 PM. Always use your most recent med list.          acyclovir ointment 5 % Commonly known as:  ZOVIRAX Apply topically every 3 (three) hours as needed.   albuterol 108 (90 Base) MCG/ACT inhaler Commonly known as:  PROVENTIL HFA;VENTOLIN HFA Inhale 2 puffs  into the lungs every 6 (six) hours as needed for wheezing (cough, shortness of breath or wheezing.).   escitalopram 10 MG tablet Commonly known as:  LEXAPRO Take 2 tablets (20 mg total) by mouth daily.   fish oil-omega-3 fatty acids 1000 MG capsule Take 2 g by mouth daily.   fluticasone 50 MCG/ACT nasal spray Commonly known as:  FLONASE Place 2 sprays into both nostrils daily.   multivitamin per tablet Take 1 tablet by mouth daily.   valACYclovir 1000 MG tablet Commonly known as:  VALTREX 2 tablets by mouth twice a day for 1 day ( total of 4 tablet for each outbreak of fever blisters)   VITAMIN D PO Take by mouth 2 (two) times daily.          Objective:   Physical Exam BP 118/72 (BP Location: Left Arm, Patient Position: Sitting, Cuff Size: Normal)   Pulse 78   Temp 98 F (36.7 C) (Oral)   Resp 14   Ht 5\' 7"  (1.702 m)   Wt 204 lb 8 oz (92.8 kg)   SpO2  98%   BMI 32.03 kg/m  General:   Well developed, well nourished . NAD.  HEENT:  Normocephalic . Face symmetric, atraumatic. TMs normal, nose not congested, throat symmetric. Neck: Full range of motion Lungs:  CTA B Normal respiratory effort, no intercostal retractions, no accessory muscle use. Heart: RRR,  no murmur.  No pretibial edema bilaterally  Skin: Not pale. Not jaundice Neurologic:  alert & oriented X3.  Speech normal, gait appropriate for age and unassisted. EOMI, pupils equal and reactive, motor and DTRs symmetric Psych--  Cognition and judgment appear intact.  Cooperative with normal attention span and concentration.  Behavior appropriate. No anxious or depressed appearing.      Assessment & Plan:   Assessment Asthma Depression 2013 Cold sores  Kidney stones, lithotripsy hepatitis B and C serology 08-2012 negative Low  vitamin D    PLAN   Headaches: Headaches for the last 6 or 7 months, > freq and intensity  from previous HAs; they are intense, happen around once a week, associated with  occasional nausea, photo and phonophobia. Description fits a migraine, he did mention that these were the worse headache of his life. Given that and  the fact that is a relatively new onset  headaches, I will get a CT head tonight. He is leaving to [place] tomorrow and he will probably live there permanently. Advise him to establish with a PCP or neurologist, very likely he will benefit from Topamax. See instructions Addendum: CT head was negative. Patient aware.

## 2016-01-15 NOTE — Progress Notes (Signed)
Pre visit review using our clinic review tool, if applicable. No additional management support is needed unless otherwise documented below in the visit note. 

## 2016-01-16 NOTE — Assessment & Plan Note (Signed)
Headaches: Headaches for the last 6 or 7 months, > freq and intensity  from previous HAs; they are intense, happen around once a week, associated with occasional nausea, photo and phonophobia. Description fits a migraine, he did mention that these were the worse headache of his life. Given that and  the fact that is a relatively new onset  headaches, I will get a CT head tonight. He is leaving to [place] tomorrow and he will probably live there permanently. Advise him to establish with a PCP or neurologist, very likely he will benefit from Topamax. See instructions Addendum: CT head was negative. Patient aware.

## 2016-03-22 IMAGING — DX DG CHEST 2V
2 series · 2 of 2 positions shown · non-contrast
Comparison: 06/01/2012

CLINICAL DATA: LEFT upper chest pain off and on since [REDACTED],
history asthma

EXAM:
CHEST  2 VIEW

[chest pa]
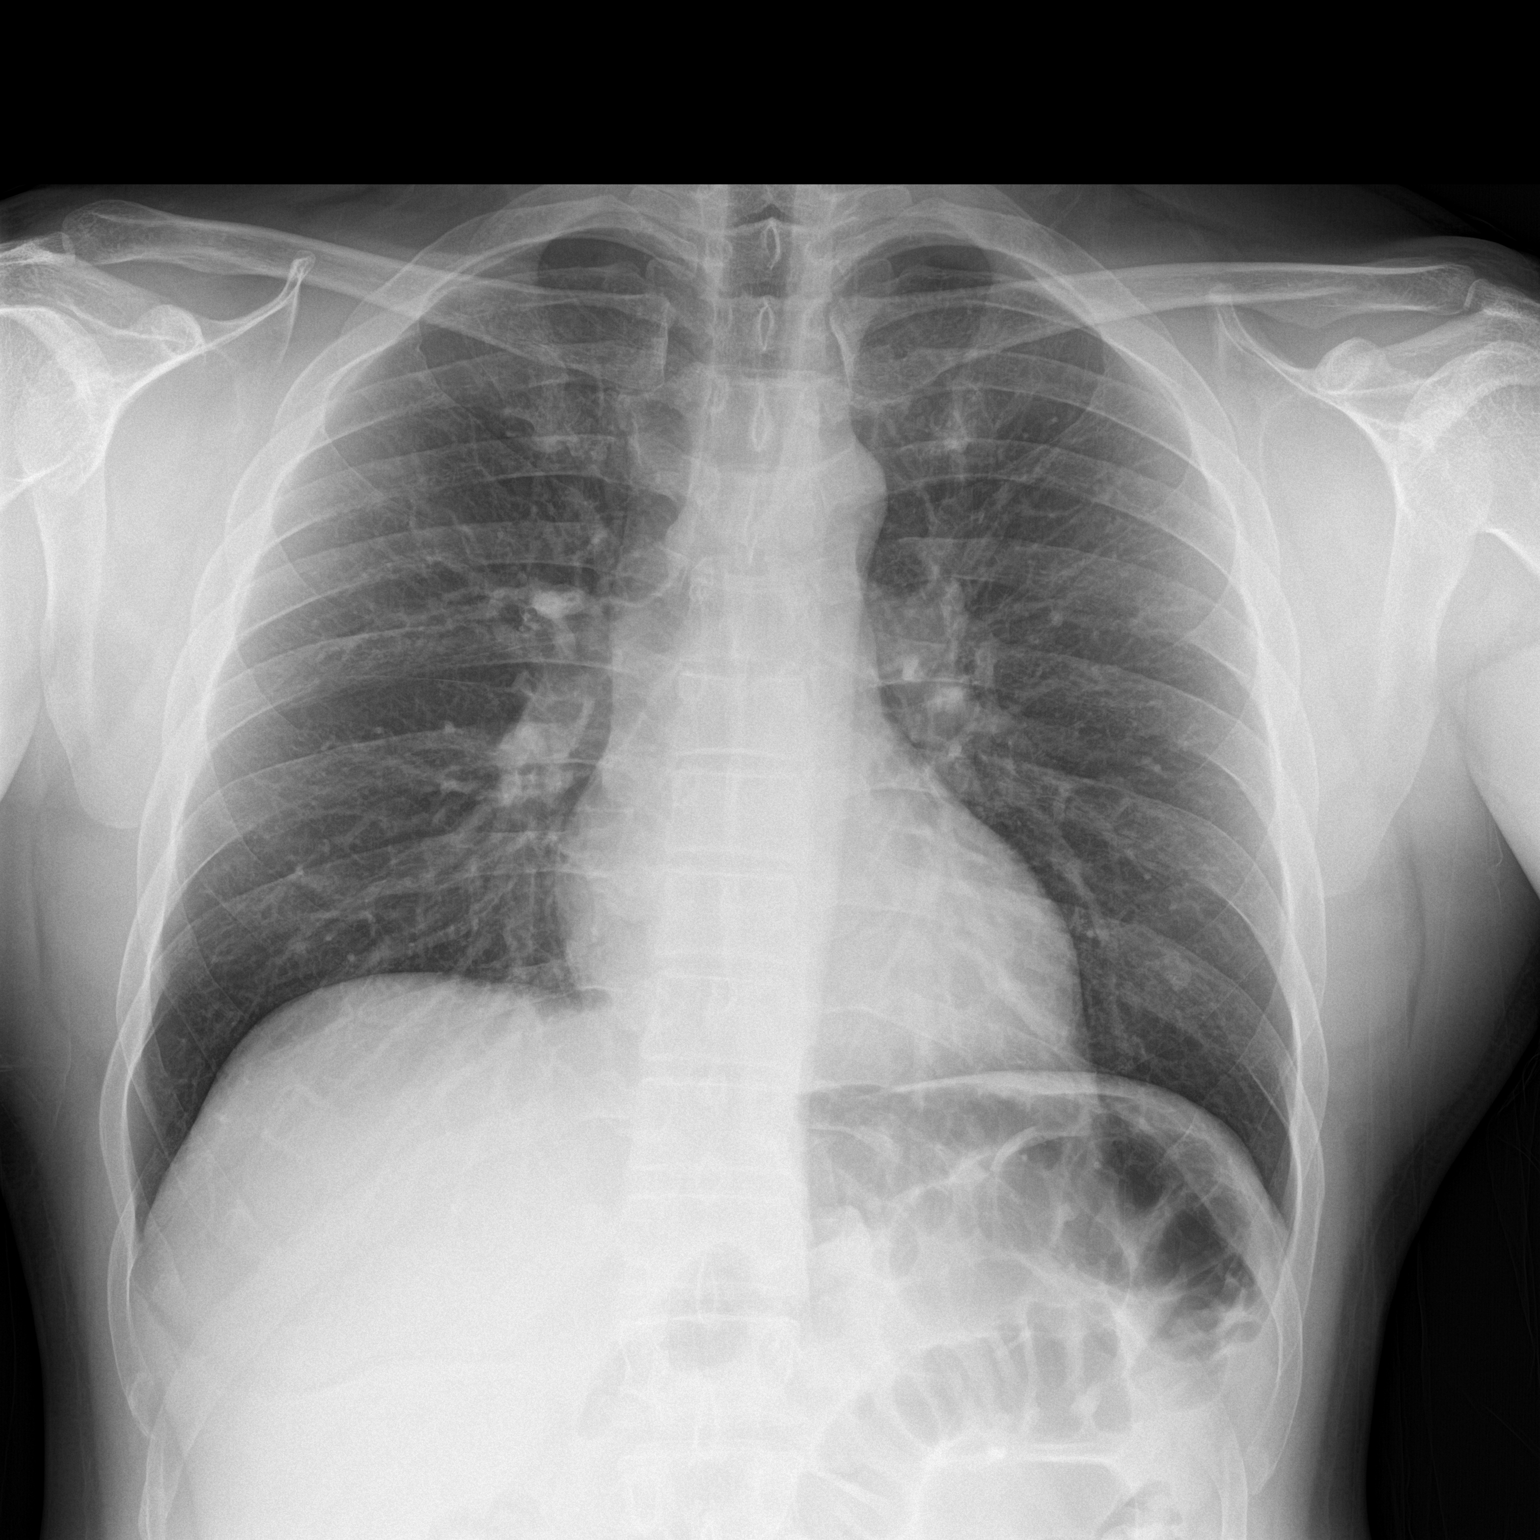

[chest lat]
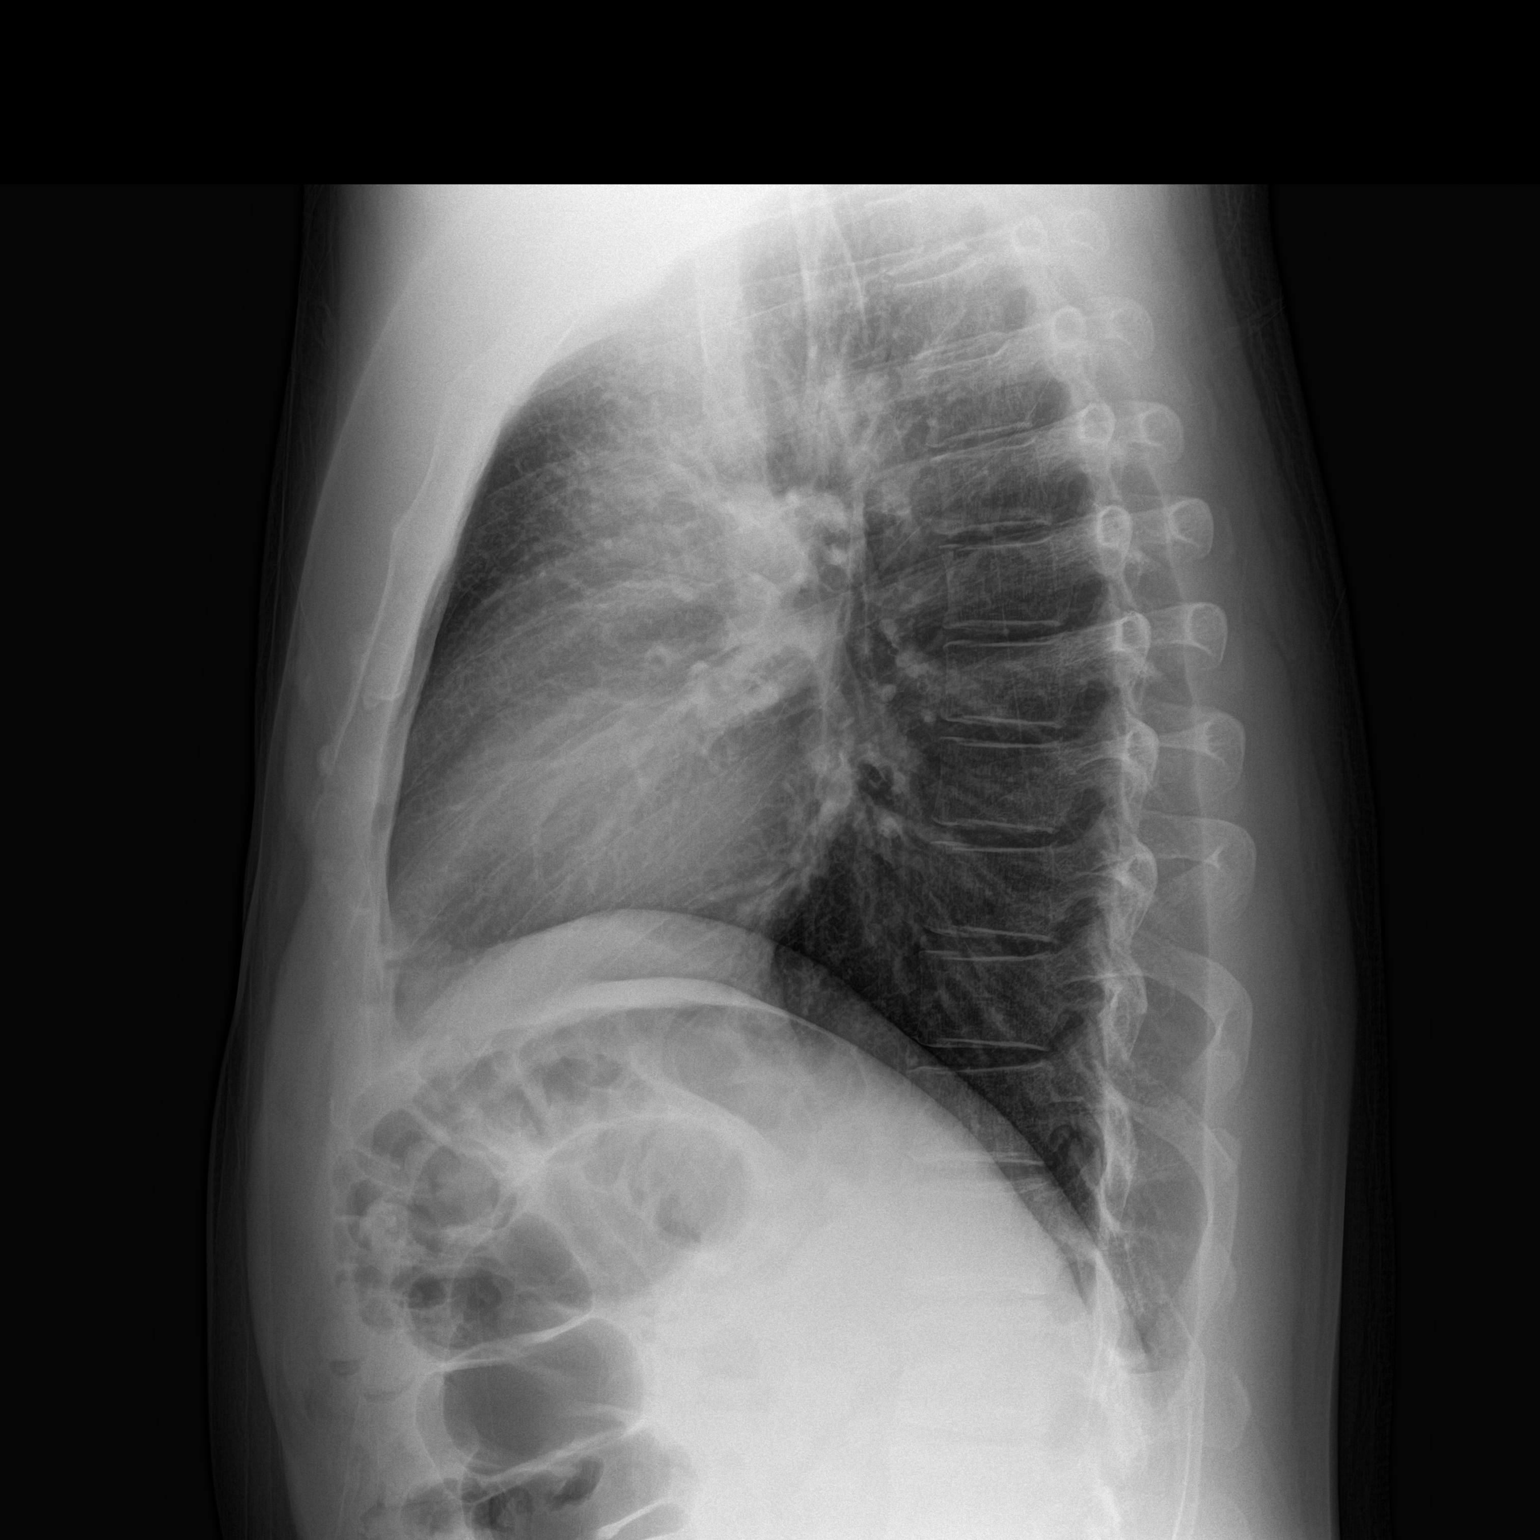

[2 of 2 positions shown; findings below may reference images not displayed]

FINDINGS: Normal heart size, mediastinal contours and pulmonary vascularity.

Lungs clear.

No pleural effusion or pneumothorax.

Bones unremarkable.

Question LEFT nipple shadow.
IMPRESSION: Question LEFT nipple shadow; followup PA chest radiograph with
nipple markers recommended to exclude pulmonary nodule.

Otherwise negative exam.

## 2016-03-23 IMAGING — DX DG CHEST 1V
1 series · 1 of 1 positions shown · non-contrast
Comparison: 03/28/2014

CLINICAL DATA: Shortness of breath and chest pain, question of
nodule versus nipple shadow over the left lung. Examination was
repeated with nipple markers.

EXAM:
CHEST  1 VIEW

[chest pa]
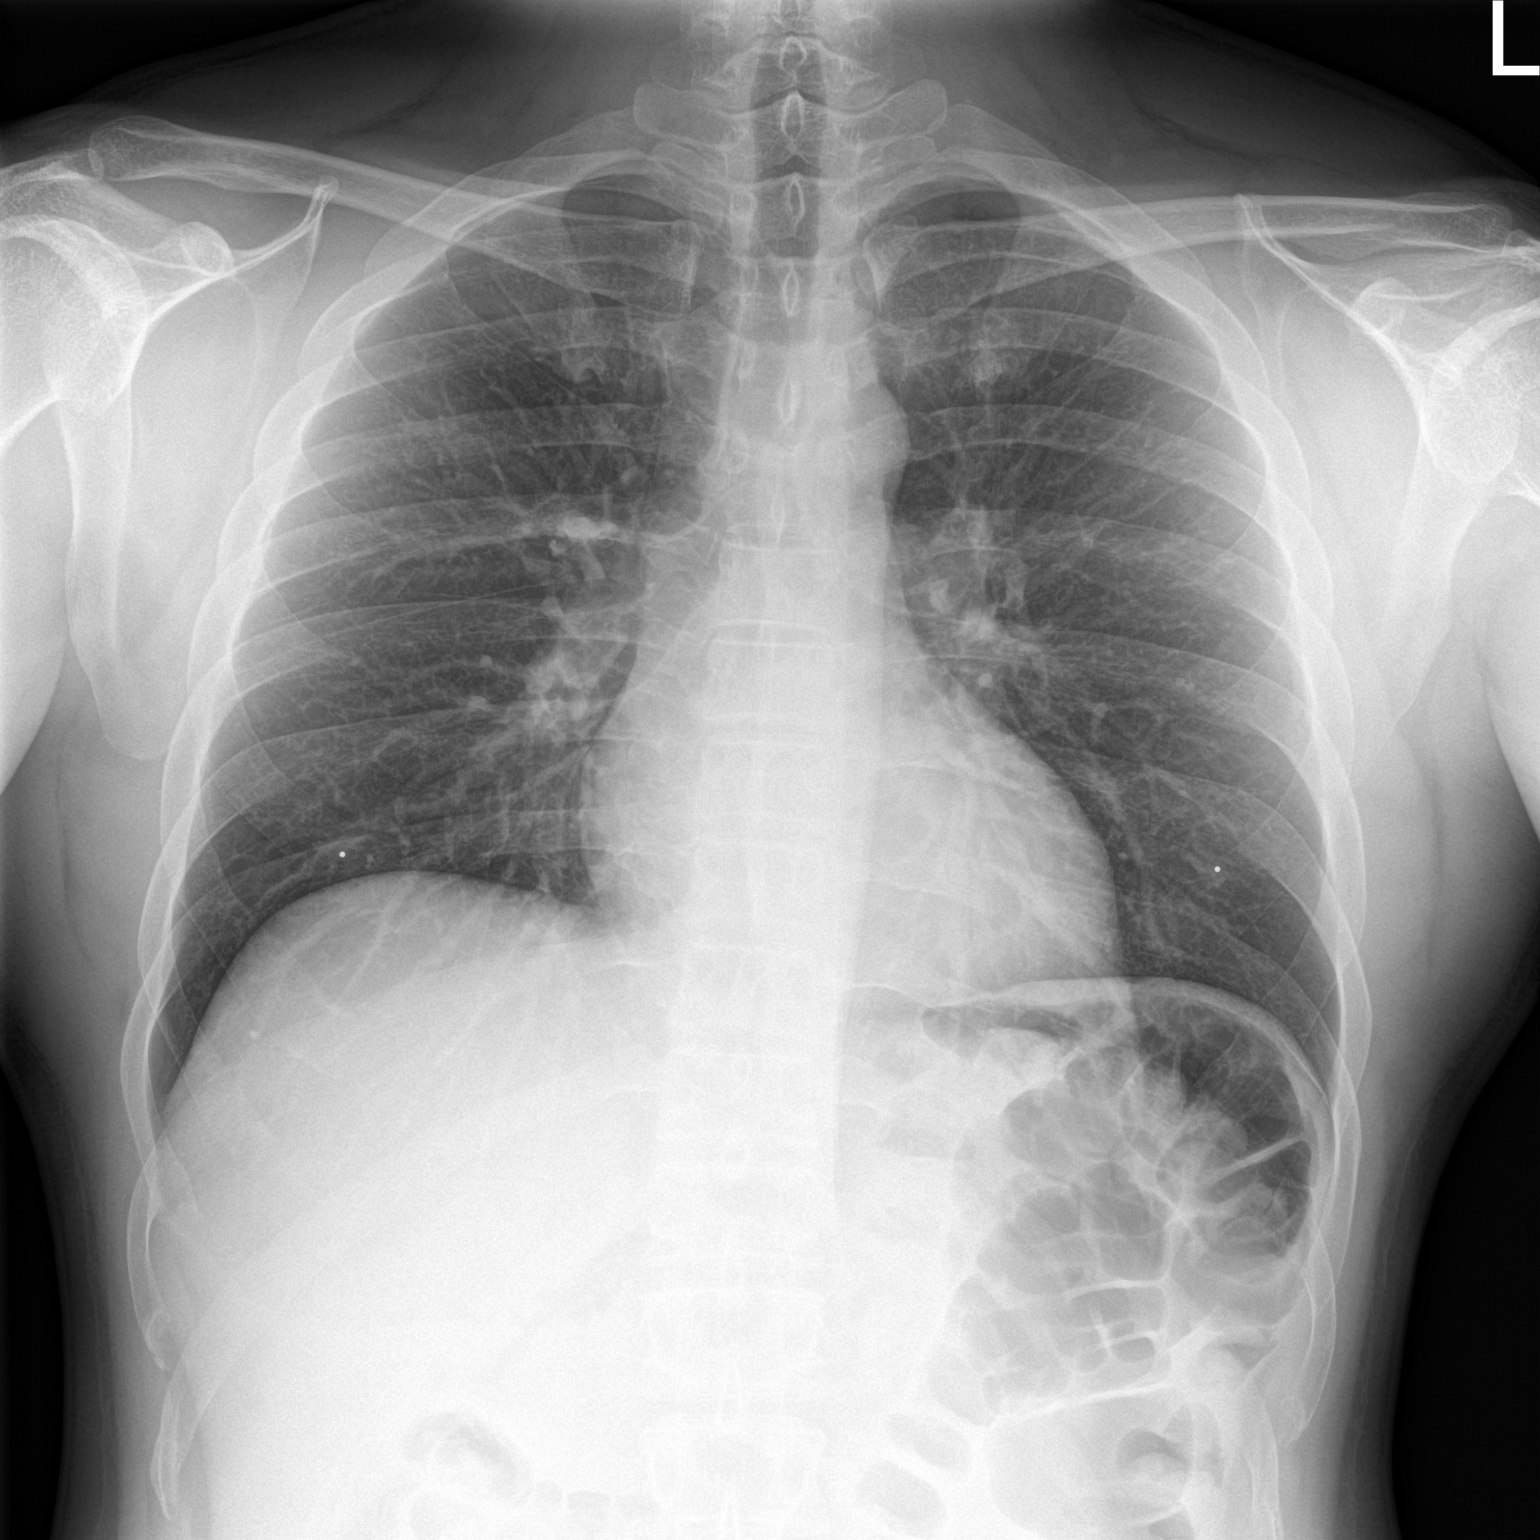

[1 of 1 positions shown; findings below may reference images not displayed]

FINDINGS: The heart size and mediastinal contours are within normal limits.
Both lungs are clear. The visualized skeletal structures are
unremarkable. No persistent nodule over either lung base; the
questioned nodule seen previously corresponds to nipple shadow.
IMPRESSION: No active disease.

## 2016-06-03 ENCOUNTER — Encounter: Payer: Self-pay | Admitting: Internal Medicine

## 2016-06-03 MED ORDER — ESCITALOPRAM OXALATE 10 MG PO TABS: 20.0000 mg | ORAL_TABLET | Freq: Every day | ORAL | 3 refills | Status: DC

## 2016-10-27 ENCOUNTER — Other Ambulatory Visit: Payer: Self-pay | Admitting: Internal Medicine
